# Patient Record
Sex: Female | Born: 1960 | Race: White | Hispanic: No | Marital: Married | Smoking: Never smoker
Health system: Southern US, Community
[De-identification: ages and names within clinical notes are randomized; demographics above are authoritative.]

## PROBLEM LIST (undated history)

## (undated) DIAGNOSIS — M1990 Unspecified osteoarthritis, unspecified site: Secondary | ICD-10-CM

## (undated) DIAGNOSIS — B009 Herpesviral infection, unspecified: Secondary | ICD-10-CM

## (undated) DIAGNOSIS — E785 Hyperlipidemia, unspecified: Secondary | ICD-10-CM

## (undated) DIAGNOSIS — G4730 Sleep apnea, unspecified: Secondary | ICD-10-CM

## (undated) DIAGNOSIS — Z973 Presence of spectacles and contact lenses: Secondary | ICD-10-CM

## (undated) DIAGNOSIS — F419 Anxiety disorder, unspecified: Secondary | ICD-10-CM

## (undated) DIAGNOSIS — S42252A Displaced fracture of greater tuberosity of left humerus, initial encounter for closed fracture: Secondary | ICD-10-CM

## (undated) DIAGNOSIS — I10 Essential (primary) hypertension: Secondary | ICD-10-CM

## (undated) DIAGNOSIS — F32A Depression, unspecified: Secondary | ICD-10-CM

## (undated) DIAGNOSIS — F329 Major depressive disorder, single episode, unspecified: Secondary | ICD-10-CM

## (undated) HISTORY — PX: WISDOM TOOTH EXTRACTION: SHX21

## (undated) HISTORY — DX: Herpesviral infection, unspecified: B00.9

---

## 1998-02-03 ENCOUNTER — Other Ambulatory Visit: Admission: RE | Admit: 1998-02-03 | Discharge: 1998-02-03 | Payer: Self-pay | Admitting: Dermatology

## 1999-10-28 ENCOUNTER — Other Ambulatory Visit: Admission: RE | Admit: 1999-10-28 | Discharge: 1999-10-28 | Payer: Self-pay | Admitting: *Deleted

## 2000-11-01 ENCOUNTER — Other Ambulatory Visit: Admission: RE | Admit: 2000-11-01 | Discharge: 2000-11-01 | Payer: Self-pay | Admitting: *Deleted

## 2001-11-01 ENCOUNTER — Other Ambulatory Visit: Admission: RE | Admit: 2001-11-01 | Discharge: 2001-11-01 | Payer: Self-pay | Admitting: *Deleted

## 2002-08-05 ENCOUNTER — Ambulatory Visit (HOSPITAL_COMMUNITY): Admission: RE | Admit: 2002-08-05 | Discharge: 2002-08-05 | Payer: Self-pay | Admitting: Family Medicine

## 2002-09-18 HISTORY — PX: TUBAL LIGATION: SHX77

## 2002-11-05 ENCOUNTER — Other Ambulatory Visit: Admission: RE | Admit: 2002-11-05 | Discharge: 2002-11-05 | Payer: Self-pay | Admitting: *Deleted

## 2003-06-09 ENCOUNTER — Ambulatory Visit (HOSPITAL_COMMUNITY): Admission: RE | Admit: 2003-06-09 | Discharge: 2003-06-09 | Payer: Self-pay | Admitting: Obstetrics and Gynecology

## 2003-06-09 ENCOUNTER — Ambulatory Visit (HOSPITAL_BASED_OUTPATIENT_CLINIC_OR_DEPARTMENT_OTHER): Admission: RE | Admit: 2003-06-09 | Discharge: 2003-06-09 | Payer: Self-pay | Admitting: Obstetrics and Gynecology

## 2003-11-11 ENCOUNTER — Other Ambulatory Visit: Admission: RE | Admit: 2003-11-11 | Discharge: 2003-11-11 | Payer: Self-pay | Admitting: *Deleted

## 2004-06-23 ENCOUNTER — Encounter: Admission: RE | Admit: 2004-06-23 | Discharge: 2004-06-23 | Payer: Self-pay | Admitting: *Deleted

## 2004-09-18 HISTORY — PX: SHOULDER ARTHROSCOPY: SHX128

## 2004-11-11 ENCOUNTER — Other Ambulatory Visit: Admission: RE | Admit: 2004-11-11 | Discharge: 2004-11-11 | Payer: Self-pay | Admitting: *Deleted

## 2005-05-15 ENCOUNTER — Ambulatory Visit (HOSPITAL_COMMUNITY): Admission: RE | Admit: 2005-05-15 | Discharge: 2005-05-15 | Payer: Self-pay | Admitting: Orthopedic Surgery

## 2005-05-15 ENCOUNTER — Ambulatory Visit (HOSPITAL_BASED_OUTPATIENT_CLINIC_OR_DEPARTMENT_OTHER): Admission: RE | Admit: 2005-05-15 | Discharge: 2005-05-15 | Payer: Self-pay | Admitting: Orthopedic Surgery

## 2005-06-26 ENCOUNTER — Encounter: Admission: RE | Admit: 2005-06-26 | Discharge: 2005-06-26 | Payer: Self-pay | Admitting: Obstetrics and Gynecology

## 2005-07-06 ENCOUNTER — Encounter: Admission: RE | Admit: 2005-07-06 | Discharge: 2005-07-06 | Payer: Self-pay | Admitting: Obstetrics and Gynecology

## 2005-11-15 ENCOUNTER — Other Ambulatory Visit: Admission: RE | Admit: 2005-11-15 | Discharge: 2005-11-15 | Payer: Self-pay | Admitting: Obstetrics & Gynecology

## 2006-07-09 ENCOUNTER — Encounter: Admission: RE | Admit: 2006-07-09 | Discharge: 2006-07-09 | Payer: Self-pay | Admitting: Obstetrics & Gynecology

## 2006-07-25 ENCOUNTER — Encounter: Admission: RE | Admit: 2006-07-25 | Discharge: 2006-07-25 | Payer: Self-pay | Admitting: Obstetrics & Gynecology

## 2006-11-16 ENCOUNTER — Other Ambulatory Visit: Admission: RE | Admit: 2006-11-16 | Discharge: 2006-11-16 | Payer: Self-pay | Admitting: Obstetrics & Gynecology

## 2007-01-23 ENCOUNTER — Encounter: Admission: RE | Admit: 2007-01-23 | Discharge: 2007-01-23 | Payer: Self-pay | Admitting: Obstetrics & Gynecology

## 2007-07-15 ENCOUNTER — Encounter: Admission: RE | Admit: 2007-07-15 | Discharge: 2007-07-15 | Payer: Self-pay | Admitting: Obstetrics & Gynecology

## 2007-12-26 ENCOUNTER — Other Ambulatory Visit: Admission: RE | Admit: 2007-12-26 | Discharge: 2007-12-26 | Payer: Self-pay | Admitting: Obstetrics & Gynecology

## 2008-07-15 ENCOUNTER — Encounter: Admission: RE | Admit: 2008-07-15 | Discharge: 2008-07-15 | Payer: Self-pay | Admitting: Obstetrics & Gynecology

## 2009-01-06 ENCOUNTER — Other Ambulatory Visit: Admission: RE | Admit: 2009-01-06 | Discharge: 2009-01-06 | Payer: Self-pay | Admitting: Obstetrics & Gynecology

## 2009-07-16 ENCOUNTER — Encounter: Admission: RE | Admit: 2009-07-16 | Discharge: 2009-07-16 | Payer: Self-pay | Admitting: Family Medicine

## 2010-07-18 ENCOUNTER — Encounter: Admission: RE | Admit: 2010-07-18 | Discharge: 2010-07-18 | Payer: Self-pay | Admitting: Family Medicine

## 2010-07-29 ENCOUNTER — Encounter: Admission: RE | Admit: 2010-07-29 | Discharge: 2010-07-29 | Payer: Self-pay | Admitting: Family Medicine

## 2010-09-24 ENCOUNTER — Encounter
Admission: RE | Admit: 2010-09-24 | Discharge: 2010-09-24 | Payer: Self-pay | Source: Home / Self Care | Attending: Family Medicine | Admitting: Family Medicine

## 2011-02-03 NOTE — Op Note (Signed)
NAMEELVIE, PALOMO               ACCOUNT NO.:  [id number]   MEDICAL RECORD NO.:  [id number]          PATIENT TYPE:  AMB   LOCATION:  DSC                          FACILITY:  [facility]   PHYSICIAN:  Robert A. Thurston Hole, M.D. DATE OF BIRTH:  03/19/1961   DATE OF PROCEDURE:  05/15/2005  DATE OF DISCHARGE:                                 OPERATIVE REPORT   PREOPERATIVE DIAGNOSIS:  1.  Left shoulder partial labrum tear with impingement.  2.  Left shoulder acromioclavicular joint arthropathy.   POSTOPERATIVE DIAGNOSIS:  1.  Left shoulder partial labrum tear with impingement.  2.  Left shoulder acromioclavicular joint arthropathy.   PROCEDURE:  1.  Left shoulder examination under anesthesia followed by arthroscopic      debridement partial labral tear.  2.  Left shoulder subacromial decompression.  3.  Left shoulder distal clavicle excision.   SURGEON:  Elana Alm. Thurston Hole, M.D.   ASSISTANT:  Julien Girt, P.A.   ANESTHESIA:  General.   OPERATIVE TIME:  40 minutes.   COMPLICATIONS:  None.   INDICATIONS FOR PROCEDURE:  Ms. Oo is a 50 year old woman who has had  significant left shoulder pain for the past 5-6 months, increasing in  nature, with exam and MRI documenting impingement with rotator cuff  tenonitis AC joint arthropathy.  She has failed conservative care and is now  to undergo arthroscopy.   DESCRIPTION OF PROCEDURE:  Ms. Buschman was brought to the operating room on  May 15, 2005, after an interscalene block had been placed in the holding  room by anesthesia.  She was placed on the operating table in supine  position.  Her left shoulder was examined.  She had full range of motion and  her shoulder was stable ligamentous exam.  She was then placed in a beach  chair position and her shoulder and arm were prepped using sterile DuraPrep  and draped using sterile technique.  Originally, through a posterior  arthroscopic portal, the arthroscope with a pump attached was  placed and  through an anterior portal, an arthroscopic probe was placed.  On initial  inspection, the articular cartilage in the glenohumeral joint was found to  be intact.  The anterior labrum had partial tearing 25% which was debrided.  The inferior labrum and anterior inferior glenohumeral ligament complex was  intact.  The superior labrum and biceps tendon anchor was intact, the biceps  tendon was intact, the rotator cuff was thoroughly inspected and it was  found to be intact.  The inferior capsular recess was free of pathology.  The subacromial space was entered and a lateral arthroscopic portal was  made.  A large amount of bursitis was resected.  The rotator cuff was  inflamed and thickened on the bursal surface but no evidence of a tear.  Impingement was noted and a subacromial decompression was carried out  removing 6 mm of the under surface of the anterior, anterolateral and  anteromedial acromion, and CA ligament release carried out, as well.  The [facility]  joint showed significant spurring and degenerative changes and the distal 5-  6 mm of  clavicle was resected with a 6 mm bur.  After this was done, the  shoulder could be brought through a full range of motion with no impingement  on the rotator cuff.  At this point, it was felt that all the pathology had  been satisfactorily addressed.  The instruments were removed.  The portals  were closed with 3-0 nylon sutures.  Sterile dressings and a sling were  applied.  The patient was awakened and taken to the recovery room in stable  condition.   FOLLOW UP CARE:  Ms. Huneke will be followed as an outpatient on Vicodin and  Celebrex.  See her back in the office in a week for sutures out and follow  up.      Robert A. Thurston Hole, M.D.  Electronically Signed     RAW/MEDQ  D:  05/15/2005  T:  05/15/2005  Job:  [id number]

## 2011-02-03 NOTE — Op Note (Signed)
[name], [name]                         ACCOUNT NO.:  [id number]   MEDICAL RECORD NO.:  [id number]                   PATIENT TYPE:  AMB   LOCATION:  [facility]                                 [facility]ACILITY:  [facility]   PHYSICIAN:  Laqueta Linden, M.D.                 DATE OF BIRTH:  Jul 29, 1961   DATE OF PROCEDURE:  06/09/2003  DATE OF DISCHARGE:                                 OPERATIVE REPORT   PREOPERATIVE DIAGNOSIS:  Desires permanent sterilization.   POSTOPERATIVE DIAGNOSIS:  Desires permanent sterilization.   PROCEDURE:  Bilateral laparoscopic tubal cauterization.   SURGEON:  Laqueta Linden, M.D.   ANESTHESIA:  General endotracheal.   ESTIMATED BLOOD LOSS:  Less than 5 mL.   COMPLICATIONS:  None.   INDICATIONS FOR PROCEDURE:  Alexa [name] is a 51 year old, gravida 1, para  1, Caucasian female who desires permanent sterilization. She had previously  been on combination birth control pills but did not feel well on these. She  stopped her pills in February and has had normal regular menses since. She  has been using barrier contraception. She now desires permanent  sterilization. She has seen the informed consent film, voiced her  understanding and acceptance of all risks including but not limited to  anesthesia risks, infection, bleeding, injury to surrounding structures, the  1/200 failure rate with an increased risk of a tubal pregnancy as well as  the permanency and the irreversibility of the procedure. She has also been  extensively counselled as to the alternatives as well as age related  menstrual changes that may ensue that may require resumption of oral  contraceptives at some point. She has seen the informed consent film, voiced  her understanding and acceptance of all risks and agrees to proceed.   DESCRIPTION OF PROCEDURE:  The patient was taken to the operating room and  after proper identification and consents were ascertained, she was placed on  the operating  table in the supine position. After the induction of general  endotracheal anesthesia, she was placed in the [place] stirrups and the  abdomen, perineum and vagina were prepped and draped in a routine sterile  fashion. The bladder was emptied with a red rubber catheter. Bimanual  examination confirmed an anterior normal size mobile uterus. A Hulka  tenaculum was placed on the anterior lip of the cervix. A 2 cm  infraumbilical incision was made and the Veress needle inserted near the  peritoneal cavity. Intraperitoneal placement was confirmed by hanging drop  and saline installation test. Three liters of CO2 were then infused to  create a pneumoperitoneum. The Veress needle was then removed and the #10/11  disposable trocar inserted without difficulty. Insertion of the laparoscope  was attached camera revealed no obvious injury or bleeding at the insertion  site. A cursory examination of the abdomen and pelvis was within normal  limits. There was a slight amount of blood  in the cul-de-sac consistent with  the patient's current menses. Her uterus was normal size and fairly mobile,  both tubes and ovaries appeared normal. The Kleppinger bipolar forceps were  then inserted, the uterus was elevated with the Hulka and the right  fallopian tube was grasped 2 cm from the cornua and cauterized until the amp  meter read zero. Successive distal cauterizations were then performed for a  total cauterized length of 3 cm. Cautery was noted to extend into the  mesosalpinx on both sides. An identical procedure was carried out on the  left fallopian tube with a total cauterized length of 3 cm. Hemostasis was  excellent. The pneumoperitoneum was allowed to escape. The scope was then  withdrawn under direct vision. The incision was closed with interrupted  subcuticular sutures of 3-0  Dexon. The incision was injected with 10 mL of  0.25% plain Marcaine. Steri-Strips and pressure dressing were applied. The   patient received Toradol 30 mg IV, 30 mg IM prior to conclusion of the  procedure. She was stable and extubated on transfer to the PACU. She will be  observed and discharged per anesthesia protocol. She will followup in the  office in 1-2 weeks time. She was given routine verbal and written discharge  instructions and understands that she needs to continue barrier  contraception until her next menstrual period. She was told to take Advil or  Aleve as needed for cramping and given a prescription for Percocet 5/325,  dispensed 15, 1-2 every 4-6h p.r.n. pain. She is to followup in the office  in 1-2 weeks or sooner for any problems.                                               Laqueta Linden, M.D.    LKS/MEDQ  D:  06/09/2003  T:  06/09/2003  Job:  [id number]

## 2011-06-28 ENCOUNTER — Other Ambulatory Visit: Payer: Self-pay | Admitting: Family Medicine

## 2011-06-28 DIAGNOSIS — Z1231 Encounter for screening mammogram for malignant neoplasm of breast: Secondary | ICD-10-CM

## 2011-08-02 ENCOUNTER — Ambulatory Visit
Admission: RE | Admit: 2011-08-02 | Discharge: 2011-08-02 | Disposition: A | Discharge status: Home / Self Care | Payer: Commercial Managed Care - PPO | Source: Ambulatory Visit | Attending: Family Medicine | Admitting: Family Medicine

## 2011-08-02 DIAGNOSIS — Z1231 Encounter for screening mammogram for malignant neoplasm of breast: Secondary | ICD-10-CM

## 2012-07-15 ENCOUNTER — Other Ambulatory Visit: Payer: Self-pay | Admitting: Family Medicine

## 2012-07-15 DIAGNOSIS — Z1231 Encounter for screening mammogram for malignant neoplasm of breast: Secondary | ICD-10-CM

## 2012-08-21 ENCOUNTER — Ambulatory Visit
Admission: RE | Admit: 2012-08-21 | Discharge: 2012-08-21 | Disposition: A | Discharge status: Home / Self Care | Payer: Commercial Managed Care - PPO | Source: Ambulatory Visit | Attending: Family Medicine | Admitting: Family Medicine

## 2012-08-21 DIAGNOSIS — Z1231 Encounter for screening mammogram for malignant neoplasm of breast: Secondary | ICD-10-CM

## 2013-01-29 ENCOUNTER — Encounter (HOSPITAL_COMMUNITY): Payer: Self-pay | Admitting: Emergency Medicine

## 2013-01-29 ENCOUNTER — Emergency Department (HOSPITAL_COMMUNITY)
Admission: EM | Admit: 2013-01-29 | Discharge: 2013-01-29 | Disposition: A | Discharge status: Home / Self Care | Payer: 59 | Attending: Emergency Medicine | Admitting: Emergency Medicine

## 2013-01-29 ENCOUNTER — Emergency Department (HOSPITAL_COMMUNITY): Payer: 59

## 2013-01-29 DIAGNOSIS — F411 Generalized anxiety disorder: Secondary | ICD-10-CM | POA: Insufficient documentation

## 2013-01-29 DIAGNOSIS — Z23 Encounter for immunization: Secondary | ICD-10-CM | POA: Insufficient documentation

## 2013-01-29 DIAGNOSIS — Z79899 Other long term (current) drug therapy: Secondary | ICD-10-CM | POA: Insufficient documentation

## 2013-01-29 DIAGNOSIS — F3289 Other specified depressive episodes: Secondary | ICD-10-CM | POA: Insufficient documentation

## 2013-01-29 DIAGNOSIS — E785 Hyperlipidemia, unspecified: Secondary | ICD-10-CM | POA: Insufficient documentation

## 2013-01-29 DIAGNOSIS — I10 Essential (primary) hypertension: Secondary | ICD-10-CM | POA: Insufficient documentation

## 2013-01-29 DIAGNOSIS — W010XXA Fall on same level from slipping, tripping and stumbling without subsequent striking against object, initial encounter: Secondary | ICD-10-CM | POA: Insufficient documentation

## 2013-01-29 DIAGNOSIS — Z7982 Long term (current) use of aspirin: Secondary | ICD-10-CM | POA: Insufficient documentation

## 2013-01-29 DIAGNOSIS — F329 Major depressive disorder, single episode, unspecified: Secondary | ICD-10-CM | POA: Insufficient documentation

## 2013-01-29 DIAGNOSIS — Y92009 Unspecified place in unspecified non-institutional (private) residence as the place of occurrence of the external cause: Secondary | ICD-10-CM | POA: Insufficient documentation

## 2013-01-29 DIAGNOSIS — Y9389 Activity, other specified: Secondary | ICD-10-CM | POA: Insufficient documentation

## 2013-01-29 DIAGNOSIS — S42253A Displaced fracture of greater tuberosity of unspecified humerus, initial encounter for closed fracture: Secondary | ICD-10-CM | POA: Insufficient documentation

## 2013-01-29 DIAGNOSIS — IMO0001 Reserved for inherently not codable concepts without codable children: Secondary | ICD-10-CM

## 2013-01-29 HISTORY — DX: Anxiety disorder, unspecified: F41.9

## 2013-01-29 HISTORY — DX: Depression, unspecified: F32.A

## 2013-01-29 HISTORY — DX: Hyperlipidemia, unspecified: E78.5

## 2013-01-29 HISTORY — DX: Major depressive disorder, single episode, unspecified: F32.9

## 2013-01-29 HISTORY — DX: Essential (primary) hypertension: I10

## 2013-01-29 LAB — COMPREHENSIVE METABOLIC PANEL
ALT: 22 U/L (ref 0–35)
Albumin: 4 g/dL (ref 3.5–5.2)
Alkaline Phosphatase: 64 U/L (ref 39–117)
Calcium: 9.4 mg/dL (ref 8.4–10.5)
GFR calc Af Amer: 90 mL/min — ABNORMAL LOW (ref >90)
Glucose, Bld: 102 mg/dL — ABNORMAL HIGH (ref 70–99)
Potassium: 3.7 mEq/L (ref 3.5–5.1)
Sodium: 140 mEq/L (ref 135–145)
Total Protein: 7.2 g/dL (ref 6.0–8.3)

## 2013-01-29 LAB — CBC WITH DIFFERENTIAL/PLATELET
Basophils Absolute: 0 10*3/uL (ref 0.0–0.1)
Basophils Relative: 0 % (ref 0–1)
Eosinophils Absolute: 0.1 10*3/uL (ref 0.0–0.7)
Eosinophils Relative: 1 % (ref 0–5)
Lymphs Abs: 1 10*3/uL (ref 0.7–4.0)
MCH: 29.1 pg (ref 26.0–34.0)
MCHC: 32.6 g/dL (ref 30.0–36.0)
MCV: 89.2 fL (ref 78.0–100.0)
Neutrophils Relative %: 84 % — ABNORMAL HIGH (ref 43–77)
Platelets: 210 10*3/uL (ref 150–400)
RBC: 4.64 MIL/uL (ref 3.87–5.11)
RDW: 13.3 % (ref 11.5–15.5)

## 2013-01-29 MED ORDER — HYDROMORPHONE HCL PF 1 MG/ML IJ SOLN
1.0000 mg | Freq: Once | INTRAMUSCULAR | Status: AC
Start: 2013-01-29 — End: 2013-01-29
  Administered 2013-01-29: 1 mg via INTRAVENOUS
  Filled 2013-01-29: qty 1

## 2013-01-29 MED ORDER — OXYCODONE-ACETAMINOPHEN 5-325 MG PO TABS: 2.0000 | ORAL_TABLET | ORAL | Status: DC | PRN

## 2013-01-29 MED ORDER — PROPOFOL 10 MG/ML IV BOLUS
INTRAVENOUS | Status: DC | PRN
  Administered 2013-01-29: 50 mg via INTRAVENOUS
  Administered 2013-01-29: 96.2 mg via INTRAVENOUS

## 2013-01-29 MED ORDER — HYDROMORPHONE HCL PF 1 MG/ML IJ SOLN
1.0000 mg | Freq: Once | INTRAMUSCULAR | Status: AC
  Administered 2013-01-29: 1 mg via INTRAVENOUS
  Filled 2013-01-29: qty 1

## 2013-01-29 MED ORDER — ONDANSETRON HCL 4 MG PO TABS: 4.0000 mg | ORAL_TABLET | Freq: Four times a day (QID) | ORAL | Status: DC

## 2013-01-29 MED ORDER — TETANUS-DIPHTH-ACELL PERTUSSIS 5-2.5-18.5 LF-MCG/0.5 IM SUSP
0.5000 mL | Freq: Once | INTRAMUSCULAR | Status: AC
  Administered 2013-01-29: 0.5 mL via INTRAMUSCULAR
  Filled 2013-01-29 (×2): qty 0.5

## 2013-01-29 MED ORDER — PROPOFOL 10 MG/ML IV BOLUS
0.5000 mg/kg | Freq: Once | INTRAVENOUS | Status: DC
  Filled 2013-01-29: qty 20

## 2013-01-29 NOTE — ED Provider Notes (Signed)
History     CSN: [id number]  Arrival date & time 01/29/13  0820   First MD Initiated Contact with Patient 01/29/13 [phone]      Chief Complaint  Patient presents with  . Shoulder Pain    (Consider location/radiation/quality/duration/timing/severity/associated sxs/prior treatment) HPI Comments: Complains of left shoulder pain after slipping and falling in her front yard. She states she was chasing after her dog and slipped on the wet grass landing with her arms stretched in front of her. Did not hit her head or lose consciousness. Denies head, neck, back, chest or abdominal pain. Complains of pain in the left lateral and posterior shoulder. No deformity noted. She reports numbness and tingling. Previous shoulder surgery about 7 years ago by Dr. Thurston Hole. She denies anticoagulant use.  The history is provided by the patient and the EMS personnel.    Past Medical History  Diagnosis Date  . Hypertension   . Hyperlipidemia   . Anxiety   . Depression     No past surgical history on file.  No family history on file.  History  Substance Use Topics  . Smoking status: Not on file  . Smokeless tobacco: Not on file  . Alcohol Use: Not on file    OB History   Grav Para Term Preterm Abortions TAB SAB Ect Mult Living                  Review of Systems  Constitutional: Negative for fever, activity change and appetite change.  HENT: Negative for congestion and rhinorrhea.   Respiratory: Negative for cough, chest tightness and shortness of breath.   Cardiovascular: Negative for chest pain.  Gastrointestinal: Negative for nausea, vomiting and abdominal pain.  Genitourinary: Negative for dysuria and hematuria.  Musculoskeletal: Positive for myalgias and arthralgias. Negative for back pain.  Skin: Negative for rash.  Neurological: Negative for dizziness, weakness and headaches.  A complete 10 system review of systems was obtained and all systems are negative except as noted in the HPI  and PMH.    Allergies  Review of patient's allergies indicates no known allergies.  Home Medications   Current Outpatient Rx  Name  Route  Sig  Dispense  Refill  . acyclovir (ZOVIRAX) 400 MG tablet   Oral   Take 400 mg by mouth daily.         Marland Kitchen aspirin EC 81 MG tablet   Oral   Take 81 mg by mouth daily.         Marland Kitchen buPROPion (WELLBUTRIN XL) 300 MG 24 hr tablet   Oral   Take 300 mg by mouth daily.         . Calcium Carb-Cholecalciferol (CALCIUM + D3) 600-200 MG-UNIT TABS   Oral   Take 2 tablets by mouth daily.         . celecoxib (CELEBREX) 200 MG capsule   Oral   Take 200 mg by mouth daily.         . fish oil-omega-3 fatty acids 1000 MG capsule   Oral   Take 1 g by mouth daily.         . hydrochlorothiazide (MICROZIDE) 12.5 MG capsule   Oral   Take 12.5 mg by mouth daily.         Marland Kitchen Lysine 500 MG TABS   Oral   Take 1 tablet by mouth daily.         . metoprolol succinate (TOPROL-XL) 100 MG 24 hr tablet   Oral  Take 50 mg by mouth 2 (two) times daily. Take with or immediately following a meal.         . Multiple Vitamin (MULTIVITAMIN WITH MINERALS) TABS   Oral   Take 1 tablet by mouth daily.         . QUEtiapine (SEROQUEL XR) 50 MG TB24   Oral   Take 50 mg by mouth at bedtime.         . simvastatin (ZOCOR) 5 MG tablet   Oral   Take 5 mg by mouth at bedtime.         Marland Kitchen venlafaxine (EFFEXOR) 75 MG tablet   Oral   Take 150 mg by mouth.         . ondansetron (ZOFRAN) 4 MG tablet   Oral   Take 1 tablet (4 mg total) by mouth every 6 (six) hours.   12 tablet   0   . oxyCODONE-acetaminophen (PERCOCET/ROXICET) 5-325 MG per tablet   Oral   Take 2 tablets by mouth every 4 (four) hours as needed for pain.   15 tablet   0     BP 123/89  Pulse 73  Temp(Src) 98 F (36.7 C) (Oral)  Resp 18  Wt 212 lb (96.163 kg)  SpO2 100%  Physical Exam  Constitutional: She is oriented to person, place, and time. She appears well-developed and  well-nourished. She appears distressed.  HENT:  Head: Normocephalic and atraumatic.  Mouth/Throat: Oropharynx is clear and moist. No oropharyngeal exudate.  Eyes: Conjunctivae and EOM are normal. Pupils are equal, round, and reactive to light.  Neck: Normal range of motion. Neck supple.  Cardiovascular: Normal rate, regular rhythm and normal heart sounds.   No murmur heard. Pulmonary/Chest: Effort normal and breath sounds normal.  Abdominal: Soft. There is no tenderness. There is no rebound and no guarding.  Musculoskeletal: Normal range of motion. She exhibits tenderness.  TTP L lateral and posterior shoulder.+2 radial pulse, cardinal hand movements intact. Axillary nerve sensation intact. FROM fingers, wrist, elbow. No clavicle tenderness  Neurological: She is alert and oriented to person, place, and time. No cranial nerve deficit. She exhibits normal muscle tone. Coordination normal.  Skin: Skin is warm.    ED Course  Procedural sedation Date/Time: 01/29/2013 4:09 PM Performed by: Glynn Octave Authorized by: Glynn Octave Consent: Verbal consent obtained. written consent obtained. Risks and benefits: risks, benefits and alternatives were discussed Consent given by: patient Patient understanding: patient states understanding of the procedure being performed Patient consent: the patient's understanding of the procedure matches consent given Patient identity confirmed: verbally with patient Time out: Immediately prior to procedure a "time out" was called to verify the correct patient, procedure, equipment, support staff and site/side marked as required. Local anesthesia used: no Patient sedated: yes Sedation type: moderate (conscious) sedation Sedatives: propofol Analgesia: hydromorphone Sedation start date/time: 01/29/2013 12:09 PM Sedation end date/time: 01/29/2013 12:23 PM Vitals: Vital signs were monitored during sedation. Patient tolerance: Patient tolerated the procedure  well with no immediate complications.   (including critical care time)  Labs Reviewed  CBC WITH DIFFERENTIAL - Abnormal; Notable for the following:    Neutrophils Relative % 84 (*)    Lymphocytes Relative 11 (*)    All other components within normal limits  COMPREHENSIVE METABOLIC PANEL - Abnormal; Notable for the following:    Glucose, Bld 102 (*)    GFR calc non Af Amer 77 (*)    GFR calc Af Amer 90 (*)    All  other components within normal limits  PROTIME-INR   Dg Chest 1 View  01/29/2013   *RADIOLOGY REPORT*  Clinical Data: Fall, shoulder pain  CHEST - 1 VIEW  Comparison: None.  Findings: Lungs are clear. No pleural effusion or pneumothorax.  The heart is normal in size.  Anterior left shoulder dislocation, better visualized on shoulder radiographs.  IMPRESSION: No evidence of acute cardiopulmonary disease.  Anterior left shoulder dislocation.   Original Report Authenticated By: Charline Bills, M.D.   Dg Elbow Complete Left  01/29/2013   *RADIOLOGY REPORT*  Clinical Data: Status post fall.  Elbow pain.  LEFT ELBOW - COMPLETE 3+ VIEW  Comparison: None.  Findings: There is no fracture, dislocation or joint effusion. Enthesopathic change at the common extensor tendon origin is incidentally noted.  IMPRESSION: No acute abnormality.   Original Report Authenticated By: Holley Dexter, M.D.   Ct Head Wo Contrast  01/29/2013   *RADIOLOGY REPORT*  Clinical Data:  Fall forward.  Trauma to front of head.  CT HEAD WITHOUT CONTRAST CT CERVICAL SPINE WITHOUT CONTRAST  Technique:  Multidetector CT imaging of the head and cervical spine was performed following the standard protocol without intravenous contrast.  Multiplanar CT image reconstructions of the cervical spine were also generated.  Comparison:  MRI of the cervical spine 09/24/2010.  CT HEAD  Findings: No acute intracranial abnormality is present. Specifically, there is no evidence for acute infarct, hemorrhage, mass, hydrocephalus, or  extra-axial fluid collection.  The paranasal sinuses and mastoid air cells are clear.  The globes and orbits are intact.  The osseous skull is intact.  IMPRESSION: Negative CT of the head.  CT CERVICAL SPINE  Findings: The cervical spine is imaged from the skull base through T2-3.  Degenerative anterolisthesis is present at C3-4 with right greater than left facet and uncovertebral disease leading to moderate right foraminal stenosis.  Mild degenerative anterolisthesis is present at C4-5.  There is chronic loss of disc height and endplate osteophyte formation at C5-6 and C6-7 with bilateral osseous foraminal narrowing, left greater than right.  The lung apices are clear.  The soft tissues are unremarkable.  IMPRESSION:  1.  Slight progression of degenerative changes in the cervical spine, most pronounced at C3-4, C5-6, and C6-7. 2.  No evidence for acute fracture or traumatic subluxation peri   Original Report Authenticated By: Marin Roberts, M.D.   Ct Cervical Spine Wo Contrast  01/29/2013   *RADIOLOGY REPORT*  Clinical Data:  Fall forward.  Trauma to front of head.  CT HEAD WITHOUT CONTRAST CT CERVICAL SPINE WITHOUT CONTRAST  Technique:  Multidetector CT imaging of the head and cervical spine was performed following the standard protocol without intravenous contrast.  Multiplanar CT image reconstructions of the cervical spine were also generated.  Comparison:  MRI of the cervical spine 09/24/2010.  CT HEAD  Findings: No acute intracranial abnormality is present. Specifically, there is no evidence for acute infarct, hemorrhage, mass, hydrocephalus, or extra-axial fluid collection.  The paranasal sinuses and mastoid air cells are clear.  The globes and orbits are intact.  The osseous skull is intact.  IMPRESSION: Negative CT of the head.  CT CERVICAL SPINE  Findings: The cervical spine is imaged from the skull base through T2-3.  Degenerative anterolisthesis is present at C3-4 with right greater than left  facet and uncovertebral disease leading to moderate right foraminal stenosis.  Mild degenerative anterolisthesis is present at C4-5.  There is chronic loss of disc height and endplate osteophyte formation at  C5-6 and C6-7 with bilateral osseous foraminal narrowing, left greater than right.  The lung apices are clear.  The soft tissues are unremarkable.  IMPRESSION:  1.  Slight progression of degenerative changes in the cervical spine, most pronounced at C3-4, C5-6, and C6-7. 2.  No evidence for acute fracture or traumatic subluxation peri   Original Report Authenticated By: Marin Roberts, M.D.   Ct Shoulder Left Wo Contrast  01/29/2013   *RADIOLOGY REPORT*  Clinical Data: Left shoulder dislocation after fall.  Pain.  Hill- Sachs lesion.  CT OF THE LEFT SHOULDER WITHOUT CONTRAST  Technique:  Multidetector CT imaging was performed according to the standard protocol. Multiplanar CT image reconstructions were also generated.  Comparison: Plain films earlier this same date.  Findings: The humerus is located.  The patient has a very large Hill-Sachs deformity with a bony fragment sheared off of the posterolateral humeral head measuring 3.5 cm AP by 1.2 cm cranial- caudal by 3.5 cm transverse identified.  A few smaller fragments are also identified off the anterior margin of the humeral head. There is no bony Bankart lesion.  The acromioclavicular joint is intact with some degenerative change noted.  No rib fracture is identified.  Imaged lung parenchyma demonstrates some mild dependent atelectasis is otherwise unremarkable.  IMPRESSION: Anterior shoulder dislocation has been reduced.  Large Hill-Sachs deformity is identified with bony fragments in the joint identified as described above.  No bony Bankart is seen.   Original Report Authenticated By: Holley Dexter, M.D.   Dg Shoulder Left  01/29/2013   *RADIOLOGY REPORT*  Clinical Data: Status post reduction of dislocation.  LEFT SHOULDER - 2+ VIEW   Comparison: Plain films left shoulder 01/29/2013 8:48 a.m.  Findings: Anterior shoulder dislocation has been reduced.  Large fracture fragment in the joint consistent with Hill-Sachs deformity again seen.  No new abnormality.  IMPRESSION: Successful reduction of anterior shoulder dislocation with a large bony fragment in the joint identified.   Original Report Authenticated By: Holley Dexter, M.D.   Dg Shoulder Left  01/29/2013   *RADIOLOGY REPORT*  Clinical Data: Fall, shoulder pain  LEFT SHOULDER - 2+ VIEW  Comparison: None.  Findings: Anterior inferior left shoulder dislocation.  Associated comminuted fracture of the humeral head with dominant fragment involving the greater tuberosity.  Mild degenerative changes of the acromioclavicular joint.  Visualized left lung is clear.  IMPRESSION: Fracture dislocation involving the left humeral head, as described above.   Original Report Authenticated By: Charline Bills, M.D.     1. Traumatic closed displaced fracture of shoulder w/anterior dislocation, left, closed, initial encounter       MDM  Mechanical fall with left shoulder pain. No loss of consciousness. L shoulder pain and deformity.  Denies head and neck pain.  Fracture dislocation of left shoulder as above. D/w Dr. Thurston Hole who patient has seen previously. Dr. Thurston Hole states not to attempt reduction and he or one of his partners will evaluate.  Patient seen with Dr. Dion Saucier. Conscious sedation performed for bedside reduction of fracture dislocation by Dr. Dion Saucier. Patient tolerated well.  Reduction of fracture dislocation confirmed on x-ray. CT scan obtained at Dr. Shelba Flake request.  Sling placed. She stable for followup with Dr. Dion Saucier one week.   Date: 01/29/2013  Rate: 69  Rhythm: normal sinus rhythm  QRS Axis: normal  Intervals: normal  ST/T Wave abnormalities: normal  Conduction Disutrbances:none  Narrative Interpretation:   Old EKG Reviewed: none available    Glynn Octave, MD 01/29/13 1611

## 2013-01-29 NOTE — Progress Notes (Signed)
Orthopedic Tech Progress Note Patient Details:  Alexa Li [birth year] [id number]  Ortho Devices Type of Ortho Device: Sling immobilizer Ortho Device/Splint Location: Shoulder abduction with Dr. Dion Saucier . Ortho Device/Splint Interventions: Application   Alexa Li, Alexa Li 01/29/2013, 12:34 PM

## 2013-01-29 NOTE — Consult Note (Signed)
ORTHOPAEDIC CONSULTATION  REQUESTING PHYSICIAN: Glynn Octave, MD  Chief Complaint: Left shoulder pain, fall  HPI: Alexa Li is a 52 y.o. female who complains of  acute severe left shoulder pain after a fall today. She was trying to chase her dog who was running out into the street. She had acute onset deformity, unable left arm, came in the emergency room. She has a past history of left shoulder arthroscopy with acromioplasty, and had full recovery after that. This is 7 years ago done by Dr. Thurston Hole. She reports some numbness and tingling around the shoulder, as well as going down towards her hand. Pain is rated as severe, denies pain in her head or other locations.  Past Medical History  Diagnosis Date  . Hypertension   . Hyperlipidemia   . Anxiety   . Depression    No past surgical history on file. History   Social History  . Marital Status: Married    Spouse Name: N/A    Number of Children: N/A  . Years of Education: N/A   Social History Main Topics  . Smoking status: Not on file  . Smokeless tobacco: Not on file  . Alcohol Use: Not on file  . Drug Use: Not on file  . Sexually Active: Not on file   Other Topics Concern  . Not on file   Social History Narrative  . No narrative on file   No family history on file. she is a nonsmoker, and her father is living, her mother had died, and has had a past history of cardiac disease. No Known Allergies Prior to Admission medications   Medication Sig Start Date End Date Taking? Authorizing Provider  acyclovir (ZOVIRAX) 400 MG tablet Take 400 mg by mouth daily.   Yes Historical Provider, MD  aspirin EC 81 MG tablet Take 81 mg by mouth daily.   Yes Historical Provider, MD  buPROPion (WELLBUTRIN XL) 300 MG 24 hr tablet Take 300 mg by mouth daily.   Yes Historical Provider, MD  Calcium Carb-Cholecalciferol (CALCIUM + D3) 600-200 MG-UNIT TABS Take 2 tablets by mouth daily.   Yes Historical Provider, MD  celecoxib (CELEBREX)  200 MG capsule Take 200 mg by mouth daily.   Yes Historical Provider, MD  fish oil-omega-3 fatty acids 1000 MG capsule Take 1 g by mouth daily.   Yes Historical Provider, MD  hydrochlorothiazide (MICROZIDE) 12.5 MG capsule Take 12.5 mg by mouth daily.   Yes Historical Provider, MD  Lysine 500 MG TABS Take 1 tablet by mouth daily.   Yes Historical Provider, MD  metoprolol succinate (TOPROL-XL) 100 MG 24 hr tablet Take 50 mg by mouth 2 (two) times daily. Take with or immediately following a meal.   Yes Historical Provider, MD  Multiple Vitamin (MULTIVITAMIN WITH MINERALS) TABS Take 1 tablet by mouth daily.   Yes Historical Provider, MD  QUEtiapine (SEROQUEL XR) 50 MG TB24 Take 50 mg by mouth at bedtime.   Yes Historical Provider, MD  simvastatin (ZOCOR) 5 MG tablet Take 5 mg by mouth at bedtime.   Yes Historical Provider, MD  venlafaxine (EFFEXOR) 75 MG tablet Take 150 mg by mouth.   Yes Historical Provider, MD   Dg Chest 1 View  01/29/2013   *RADIOLOGY REPORT*  Clinical Data: Fall, shoulder pain  CHEST - 1 VIEW  Comparison: None.  Findings: Lungs are clear. No pleural effusion or pneumothorax.  The heart is normal in size.  Anterior left shoulder dislocation, better visualized on shoulder  radiographs.  IMPRESSION: No evidence of acute cardiopulmonary disease.  Anterior left shoulder dislocation.   Original Report Authenticated By: Charline Bills, M.D.   Dg Shoulder Left  01/29/2013   *RADIOLOGY REPORT*  Clinical Data: Fall, shoulder pain  LEFT SHOULDER - 2+ VIEW  Comparison: None.  Findings: Anterior inferior left shoulder dislocation.  Associated comminuted fracture of the humeral head with dominant fragment involving the greater tuberosity.  Mild degenerative changes of the acromioclavicular joint.  Visualized left lung is clear.  IMPRESSION: Fracture dislocation involving the left humeral head, as described above.   Original Report Authenticated By: Charline Bills, M.D.    Positive ROS: All  other systems have been reviewed and were otherwise negative with the exception of those mentioned in the HPI and as above.  Physical Exam: General: Alert, in mild distress Cardiovascular: No pedal edema, radial pulses intact on the left upper extremity. Respiratory: No cyanosis, no use of accessory musculature GI: No organomegaly, abdomen is soft and non-tender Skin: No lesions in the area of chief complaint, although she does have a abrasion over the left elbow Neurologic: Decreased sensation over her deltoid, all of her fingers do flex extend and abduct and she seems to have sensation intact throughout her hand. Psychiatric: Patient is competent for consent with normal mood and affect Lymphatic: No axillary or cervical lymphadenopathy  MUSCULOSKELETAL: Left shoulder has gross deformity, unable to lift, with indentation and loss of contour of the lateral shoulder.  Assessment: Left proximal humerus fracture dislocation  Plan: This is an acute severe injury, and may have an associated axillary nerve palsy with that. I discussed the options with her, and recommended urgent closed reduction, with postreduction CAT scan to determine the position of the greater tuberosity fragment. She may ultimately need surgical intervention to fix her tuberosity.  The risks benefits and alternatives were discussed to the urgent closed reduction, and she is willing to proceed. We'll do this today under conscious sedation in the emergency room.  She will then be placed in a sling, given pain medications, get postreduction CAT scan, and followup and with me in the office in one week. I may contact the family sooner if, at multiple number [phone] if a determination for surgical intervention is made sooner than her followup.  Preprocedure diagnosis: Left shoulder proximal humerus fracture dislocation  Postprocedure diagnosis: Same  Procedure: Left shoulder closed reduction under conscious  sedation  Procedure details: After informed verbal consent and written consent was obtained obtained from the patient and the family and a timeout was performed propofol was utilized monitored by the anesthesia provider, Dr. Viviann Spare rang core, and closed reduction of the left shoulder was performed. This is fairly challenging to get back into place, ultimately forward flexion to 180 with manipulation of the humeral head in the axilla manually was able to achieve satisfactory reduction. A satisfactory clunk was achieved. A sling immobilizer was applied, and the patient awakened, and post reduction CT scan is pending.      Eulas Post, MD Cell [phone]   01/29/2013 12:20 PM

## 2013-01-29 NOTE — ED Notes (Signed)
Family at bedside. 

## 2013-01-29 NOTE — ED Notes (Signed)
Patient transported to CT 

## 2013-01-29 NOTE — ED Notes (Signed)
According to EMS, the patient slipped and fell in the front yard on her stomach, face first with arms over head.  The patient denies any other pain or LOC.  She called EMS and she was transported to [facility].  The patient received of Fentanyl and transported to he ED.  According to EMS, she has PMS, no deformities.  They did start a # 20gauge in Right hand.

## 2013-01-29 NOTE — ED Notes (Signed)
Patient starting to wake up, with facial grimacing and pulling arm away from MD. Dr. Manus Gunning verbally ordered more sedation.

## 2013-01-30 ENCOUNTER — Encounter (HOSPITAL_BASED_OUTPATIENT_CLINIC_OR_DEPARTMENT_OTHER): Payer: Self-pay | Admitting: Anesthesiology

## 2013-01-30 ENCOUNTER — Ambulatory Visit (HOSPITAL_BASED_OUTPATIENT_CLINIC_OR_DEPARTMENT_OTHER)
Admission: RE | Admit: 2013-01-30 | Discharge: 2013-01-30 | Disposition: A | Discharge status: Home / Self Care | Payer: 59 | Source: Ambulatory Visit | Attending: Orthopedic Surgery | Admitting: Orthopedic Surgery

## 2013-01-30 ENCOUNTER — Encounter (HOSPITAL_BASED_OUTPATIENT_CLINIC_OR_DEPARTMENT_OTHER)
Admission: RE | Disposition: A | Discharge status: Home / Self Care | Payer: Self-pay | Source: Ambulatory Visit | Attending: Orthopedic Surgery

## 2013-01-30 ENCOUNTER — Ambulatory Visit (HOSPITAL_BASED_OUTPATIENT_CLINIC_OR_DEPARTMENT_OTHER): Payer: 59 | Admitting: Anesthesiology

## 2013-01-30 ENCOUNTER — Ambulatory Visit (HOSPITAL_BASED_OUTPATIENT_CLINIC_OR_DEPARTMENT_OTHER)
Admission: RE | Admit: 2013-01-30 | Payer: Commercial Managed Care - PPO | Source: Ambulatory Visit | Admitting: Orthopedic Surgery

## 2013-01-30 ENCOUNTER — Ambulatory Visit (HOSPITAL_COMMUNITY): Payer: 59

## 2013-01-30 ENCOUNTER — Encounter (HOSPITAL_BASED_OUTPATIENT_CLINIC_OR_DEPARTMENT_OTHER): Payer: Self-pay | Admitting: *Deleted

## 2013-01-30 DIAGNOSIS — F329 Major depressive disorder, single episode, unspecified: Secondary | ICD-10-CM | POA: Insufficient documentation

## 2013-01-30 DIAGNOSIS — W19XXXA Unspecified fall, initial encounter: Secondary | ICD-10-CM | POA: Insufficient documentation

## 2013-01-30 DIAGNOSIS — F3289 Other specified depressive episodes: Secondary | ICD-10-CM | POA: Insufficient documentation

## 2013-01-30 DIAGNOSIS — F411 Generalized anxiety disorder: Secondary | ICD-10-CM | POA: Insufficient documentation

## 2013-01-30 DIAGNOSIS — S42252A Displaced fracture of greater tuberosity of left humerus, initial encounter for closed fracture: Secondary | ICD-10-CM

## 2013-01-30 DIAGNOSIS — S42253A Displaced fracture of greater tuberosity of unspecified humerus, initial encounter for closed fracture: Secondary | ICD-10-CM | POA: Insufficient documentation

## 2013-01-30 DIAGNOSIS — E785 Hyperlipidemia, unspecified: Secondary | ICD-10-CM | POA: Insufficient documentation

## 2013-01-30 DIAGNOSIS — I10 Essential (primary) hypertension: Secondary | ICD-10-CM | POA: Insufficient documentation

## 2013-01-30 HISTORY — DX: Displaced fracture of greater tuberosity of left humerus, initial encounter for closed fracture: S42.252A

## 2013-01-30 HISTORY — PX: ORIF HUMERUS FRACTURE: SHX2126

## 2013-01-30 HISTORY — DX: Presence of spectacles and contact lenses: Z97.3

## 2013-01-30 SURGERY — OPEN REDUCTION INTERNAL FIXATION (ORIF) PROXIMAL HUMERUS FRACTURE
Anesthesia: Regional | Site: Arm Upper | Laterality: Left | Wound class: Clean

## 2013-01-30 MED ORDER — BUPIVACAINE-EPINEPHRINE PF 0.5-1:200000 % IJ SOLN
INTRAMUSCULAR | Status: DC | PRN
  Administered 2013-01-30: 30 mL

## 2013-01-30 MED ORDER — LACTATED RINGERS IV SOLN
INTRAVENOUS | Status: DC
  Administered 2013-01-30 (×2): via INTRAVENOUS

## 2013-01-30 MED ORDER — OXYCODONE-ACETAMINOPHEN 10-325 MG PO TABS: 1.0000 | ORAL_TABLET | Freq: Four times a day (QID) | ORAL | Status: DC | PRN

## 2013-01-30 MED ORDER — PROPOFOL 10 MG/ML IV BOLUS
INTRAVENOUS | Status: DC | PRN
  Administered 2013-01-30: 170 mg via INTRAVENOUS

## 2013-01-30 MED ORDER — ONDANSETRON HCL 4 MG/2ML IJ SOLN
INTRAMUSCULAR | Status: DC | PRN
  Administered 2013-01-30: 4 mg via INTRAVENOUS

## 2013-01-30 MED ORDER — SENNA-DOCUSATE SODIUM 8.6-50 MG PO TABS: 1.0000 | ORAL_TABLET | Freq: Every day | ORAL | Status: DC

## 2013-01-30 MED ORDER — DEXAMETHASONE SODIUM PHOSPHATE 4 MG/ML IJ SOLN
INTRAMUSCULAR | Status: DC | PRN
  Administered 2013-01-30: 10 mg via INTRAVENOUS

## 2013-01-30 MED ORDER — FENTANYL CITRATE 0.05 MG/ML IJ SOLN: 50.0000 ug | INTRAMUSCULAR | Status: DC | PRN

## 2013-01-30 MED ORDER — PHENYLEPHRINE HCL 10 MG/ML IJ SOLN
10.0000 mg | INTRAVENOUS | Status: DC | PRN
  Administered 2013-01-30: 50 ug/min via INTRAVENOUS

## 2013-01-30 MED ORDER — METHOCARBAMOL 500 MG PO TABS: 500.0000 mg | ORAL_TABLET | Freq: Four times a day (QID) | ORAL | Status: DC

## 2013-01-30 MED ORDER — MIDAZOLAM HCL 2 MG/2ML IJ SOLN: 1.0000 mg | INTRAMUSCULAR | Status: DC | PRN

## 2013-01-30 MED ORDER — MIDAZOLAM HCL 2 MG/2ML IJ SOLN
1.0000 mg | INTRAMUSCULAR | Status: DC | PRN
  Administered 2013-01-30: 2 mg via INTRAVENOUS

## 2013-01-30 MED ORDER — CEFAZOLIN SODIUM-DEXTROSE 2-3 GM-% IV SOLR
2.0000 g | INTRAVENOUS | Status: AC
  Administered 2013-01-30: 2 g via INTRAVENOUS

## 2013-01-30 MED ORDER — LIDOCAINE HCL (CARDIAC) 20 MG/ML IV SOLN
INTRAVENOUS | Status: DC | PRN
  Administered 2013-01-30: 100 mg via INTRAVENOUS

## 2013-01-30 MED ORDER — OXYCODONE HCL 5 MG/5ML PO SOLN: 5.0000 mg | Freq: Once | ORAL | Status: DC | PRN

## 2013-01-30 MED ORDER — HYDROMORPHONE HCL PF 1 MG/ML IJ SOLN: 0.2500 mg | INTRAMUSCULAR | Status: DC | PRN

## 2013-01-30 MED ORDER — SUCCINYLCHOLINE CHLORIDE 20 MG/ML IJ SOLN
INTRAMUSCULAR | Status: DC | PRN
  Administered 2013-01-30: 100 mg via INTRAVENOUS

## 2013-01-30 MED ORDER — ONDANSETRON HCL 4 MG/2ML IJ SOLN: 4.0000 mg | Freq: Four times a day (QID) | INTRAMUSCULAR | Status: DC | PRN

## 2013-01-30 MED ORDER — FENTANYL CITRATE 0.05 MG/ML IJ SOLN
50.0000 ug | INTRAMUSCULAR | Status: DC | PRN
Start: 2013-01-30 — End: 2013-01-30
  Administered 2013-01-30: 100 ug via INTRAVENOUS

## 2013-01-30 MED ORDER — OXYCODONE HCL 5 MG PO TABS: 5.0000 mg | ORAL_TABLET | Freq: Once | ORAL | Status: DC | PRN

## 2013-01-30 MED ORDER — PROMETHAZINE HCL 25 MG PO TABS: 25.0000 mg | ORAL_TABLET | Freq: Four times a day (QID) | ORAL | Status: DC | PRN

## 2013-01-30 SURGICAL SUPPLY — 71 items
BENZOIN TINCTURE PRP APPL 2/3 (GAUZE/BANDAGES/DRESSINGS) IMPLANT
BIT DRILL 5/64X5 DISP (BIT) ×2 IMPLANT
BIT DRILL CANN 2.7X625 NONSTRL (BIT) ×2 IMPLANT
BIT DRILL LNG QR ACUTRAK 2 (BIT) ×2 IMPLANT
BLADE SURG 10 STRL SS (BLADE) ×2 IMPLANT
BLADE SURG 15 STRL LF DISP TIS (BLADE) ×2 IMPLANT
BLADE SURG 15 STRL SS (BLADE) ×2
CANISTER SUCTION 2500CC (MISCELLANEOUS) ×2 IMPLANT
CLEANER CAUTERY TIP 5X5 PAD (MISCELLANEOUS) ×1 IMPLANT
CLOTH BEACON ORANGE TIMEOUT ST (SAFETY) ×2 IMPLANT
DECANTER SPIKE VIAL GLASS SM (MISCELLANEOUS) IMPLANT
DRAPE C-ARM 42X72 X-RAY (DRAPES) ×2 IMPLANT
DRAPE INCISE IOBAN 66X45 STRL (DRAPES) ×2 IMPLANT
DRAPE SURG 17X23 STRL (DRAPES) ×2 IMPLANT
DRAPE U 20/CS (DRAPES) ×2 IMPLANT
DRAPE U-SHAPE 47X51 STRL (DRAPES) ×2 IMPLANT
DRAPE U-SHAPE 76X120 STRL (DRAPES) ×4 IMPLANT
DURAPREP 26ML APPLICATOR (WOUND CARE) ×2 IMPLANT
ELECT REM PT RETURN 9FT ADLT (ELECTROSURGICAL) ×2
ELECTRODE REM PT RTRN 9FT ADLT (ELECTROSURGICAL) ×1 IMPLANT
GAUZE SPONGE 4X4 16PLY XRAY LF (GAUZE/BANDAGES/DRESSINGS) IMPLANT
GAUZE XEROFORM 1X8 LF (GAUZE/BANDAGES/DRESSINGS) IMPLANT
GLOVE BIO SURGEON STRL SZ 6.5 (GLOVE) ×2 IMPLANT
GLOVE BIOGEL PI IND STRL 6.5 (GLOVE) ×1 IMPLANT
GLOVE BIOGEL PI IND STRL 7.0 (GLOVE) ×2 IMPLANT
GLOVE BIOGEL PI IND STRL 8 (GLOVE) ×2 IMPLANT
GLOVE BIOGEL PI INDICATOR 6.5 (GLOVE) ×1
GLOVE BIOGEL PI INDICATOR 7.0 (GLOVE) ×2
GLOVE BIOGEL PI INDICATOR 8 (GLOVE) ×2
GLOVE ECLIPSE 6.5 STRL STRAW (GLOVE) ×2 IMPLANT
GLOVE ORTHO TXT STRL SZ7.5 (GLOVE) ×2 IMPLANT
GLOVE SURG ORTHO 8.0 STRL STRW (GLOVE) ×2 IMPLANT
GOWN BRE IMP PREV XXLGXLNG (GOWN DISPOSABLE) ×4 IMPLANT
GOWN PREVENTION PLUS XLARGE (GOWN DISPOSABLE) ×6 IMPLANT
GUIDEWARE NON THREAD 1.25X150 (WIRE) ×4
GUIDEWIRE NON THREAD 1.25X150 (WIRE) ×2 IMPLANT
NEEDLE MAYO 6 CRC TAPER PT (NEEDLE) ×2 IMPLANT
NS IRRIG 1000ML POUR BTL (IV SOLUTION) ×2 IMPLANT
PACK ARTHROSCOPY DSU (CUSTOM PROCEDURE TRAY) ×2 IMPLANT
PACK BASIN DAY SURGERY FS (CUSTOM PROCEDURE TRAY) ×2 IMPLANT
PAD CLEANER CAUTERY TIP 5X5 (MISCELLANEOUS) ×1
PENCIL BUTTON HOLSTER BLD 10FT (ELECTRODE) ×2 IMPLANT
SCREW ACUTRAK 2 STD 34MM (Screw) ×2 IMPLANT
SCREW CANN L THRD/36 4.0 (Screw) ×2 IMPLANT
SLEEVE SCD COMPRESS KNEE MED (MISCELLANEOUS) ×2 IMPLANT
SLING ARM FOAM STRAP LRG (SOFTGOODS) IMPLANT
SLING ARM FOAM STRAP MED (SOFTGOODS) IMPLANT
SLING ARM FOAM STRAP XLG (SOFTGOODS) IMPLANT
SLING ARM IMMOBILIZER LRG (SOFTGOODS) IMPLANT
SLING ARM IMMOBILIZER MED (SOFTGOODS) IMPLANT
SPONGE GAUZE 4X4 12PLY (GAUZE/BANDAGES/DRESSINGS) ×2 IMPLANT
SPONGE LAP 18X18 X RAY DECT (DISPOSABLE) ×2 IMPLANT
STRIP CLOSURE SKIN 1/2X4 (GAUZE/BANDAGES/DRESSINGS) IMPLANT
SUCTION FRAZIER TIP 10 FR DISP (SUCTIONS) IMPLANT
SUPPORT WRAP ARM LG (MISCELLANEOUS) IMPLANT
SUT FIBERWIRE #2 38 T-5 BLUE (SUTURE)
SUT MNCRL AB 4-0 PS2 18 (SUTURE) ×4 IMPLANT
SUT RETRIEVER MED (INSTRUMENTS) ×2 IMPLANT
SUT VIC AB 0 CT1 18XCR BRD 8 (SUTURE) ×1 IMPLANT
SUT VIC AB 0 CT1 27 (SUTURE)
SUT VIC AB 0 CT1 27XBRD ANBCTR (SUTURE) IMPLANT
SUT VIC AB 0 CT1 8-18 (SUTURE) ×1
SUT VIC AB 2-0 SH 27 (SUTURE)
SUT VIC AB 2-0 SH 27XBRD (SUTURE) IMPLANT
SUT VICRYL 3-0 CR8 SH (SUTURE) ×4 IMPLANT
SUTURE FIBERWR #2 38 T-5 BLUE (SUTURE) IMPLANT
SYR BULB 3OZ (MISCELLANEOUS) IMPLANT
SYR BULB IRRIGATION 50ML (SYRINGE) ×2 IMPLANT
TAPE STRIPS DRAPE STRL (GAUZE/BANDAGES/DRESSINGS) IMPLANT
TOWEL OR 17X24 6PK STRL BLUE (TOWEL DISPOSABLE) ×2 IMPLANT
YANKAUER SUCT BULB TIP NO VENT (SUCTIONS) ×2 IMPLANT

## 2013-01-30 NOTE — Interval H&P Note (Signed)
History and Physical Interval Note:  01/30/2013 11:11 AM  Alexa Li  has presented today for surgery, with the diagnosis of left proximal humerus fracture  The various methods of treatment have been discussed with the patient and family. After consideration of risks, benefits and other options for treatment, the patient has consented to  Procedure(s): OPEN REDUCTION INTERNAL FIXATION (ORIF) PROXIMAL HUMERUS FRACTURE (Left) as a surgical intervention .  The patient's history has been reviewed, patient examined, no change in status, stable for surgery.  I have reviewed the patient's chart and labs.  Questions were answered to the patient's satisfaction.     Tiyon Sanor P

## 2013-01-30 NOTE — H&P (View-Only) (Signed)
ORTHOPAEDIC CONSULTATION  REQUESTING PHYSICIAN: Stephen Rancour, MD  Chief Complaint: Left shoulder pain, fall  HPI: Alexa Li is a 52 y.o. female who complains of  acute severe left shoulder pain after a fall today. She was trying to chase her dog who was running out into the street. She had acute onset deformity, unable left arm, came in the emergency room. She has a past history of left shoulder arthroscopy with acromioplasty, and had full recovery after that. This is 7 years ago done by Dr. Wainer. She reports some numbness and tingling around the shoulder, as well as going down towards her hand. Pain is rated as severe, denies pain in her head or other locations.  Past Medical History  Diagnosis Date  . Hypertension   . Hyperlipidemia   . Anxiety   . Depression    No past surgical history on file. History   Social History  . Marital Status: Married    Spouse Name: N/A    Number of Children: N/A  . Years of Education: N/A   Social History Main Topics  . Smoking status: Not on file  . Smokeless tobacco: Not on file  . Alcohol Use: Not on file  . Drug Use: Not on file  . Sexually Active: Not on file   Other Topics Concern  . Not on file   Social History Narrative  . No narrative on file   No family history on file. she is a nonsmoker, and her father is living, her mother had died, and has had a past history of cardiac disease. No Known Allergies Prior to Admission medications   Medication Sig Start Date End Date Taking? Authorizing Provider  acyclovir (ZOVIRAX) 400 MG tablet Take 400 mg by mouth daily.   Yes Historical Provider, MD  aspirin EC 81 MG tablet Take 81 mg by mouth daily.   Yes Historical Provider, MD  buPROPion (WELLBUTRIN XL) 300 MG 24 hr tablet Take 300 mg by mouth daily.   Yes Historical Provider, MD  Calcium Carb-Cholecalciferol (CALCIUM + D3) 600-200 MG-UNIT TABS Take 2 tablets by mouth daily.   Yes Historical Provider, MD  celecoxib (CELEBREX)  200 MG capsule Take 200 mg by mouth daily.   Yes Historical Provider, MD  fish oil-omega-3 fatty acids 1000 MG capsule Take 1 g by mouth daily.   Yes Historical Provider, MD  hydrochlorothiazide (MICROZIDE) 12.5 MG capsule Take 12.5 mg by mouth daily.   Yes Historical Provider, MD  Lysine 500 MG TABS Take 1 tablet by mouth daily.   Yes Historical Provider, MD  metoprolol succinate (TOPROL-XL) 100 MG 24 hr tablet Take 50 mg by mouth 2 (two) times daily. Take with or immediately following a meal.   Yes Historical Provider, MD  Multiple Vitamin (MULTIVITAMIN WITH MINERALS) TABS Take 1 tablet by mouth daily.   Yes Historical Provider, MD  QUEtiapine (SEROQUEL XR) 50 MG TB24 Take 50 mg by mouth at bedtime.   Yes Historical Provider, MD  simvastatin (ZOCOR) 5 MG tablet Take 5 mg by mouth at bedtime.   Yes Historical Provider, MD  venlafaxine (EFFEXOR) 75 MG tablet Take 150 mg by mouth.   Yes Historical Provider, MD   Dg Chest 1 View  01/29/2013   *RADIOLOGY REPORT*  Clinical Data: Fall, shoulder pain  CHEST - 1 VIEW  Comparison: None.  Findings: Lungs are clear. No pleural effusion or pneumothorax.  The heart is normal in size.  Anterior left shoulder dislocation, better visualized on shoulder   radiographs.  IMPRESSION: No evidence of acute cardiopulmonary disease.  Anterior left shoulder dislocation.   Original Report Authenticated By: Sriyesh Krishnan, M.D.   Dg Shoulder Left  01/29/2013   *RADIOLOGY REPORT*  Clinical Data: Fall, shoulder pain  LEFT SHOULDER - 2+ VIEW  Comparison: None.  Findings: Anterior inferior left shoulder dislocation.  Associated comminuted fracture of the humeral head with dominant fragment involving the greater tuberosity.  Mild degenerative changes of the acromioclavicular joint.  Visualized left lung is clear.  IMPRESSION: Fracture dislocation involving the left humeral head, as described above.   Original Report Authenticated By: Sriyesh Krishnan, M.D.    Positive ROS: All  other systems have been reviewed and were otherwise negative with the exception of those mentioned in the HPI and as above.  Physical Exam: General: Alert, in mild distress Cardiovascular: No pedal edema, radial pulses intact on the left upper extremity. Respiratory: No cyanosis, no use of accessory musculature GI: No organomegaly, abdomen is soft and non-tender Skin: No lesions in the area of chief complaint, although she does have a abrasion over the left elbow Neurologic: Decreased sensation over her deltoid, all of her fingers do flex extend and abduct and she seems to have sensation intact throughout her hand. Psychiatric: Patient is competent for consent with normal mood and affect Lymphatic: No axillary or cervical lymphadenopathy  MUSCULOSKELETAL: Left shoulder has gross deformity, unable to lift, with indentation and loss of contour of the lateral shoulder.  Assessment: Left proximal humerus fracture dislocation  Plan: This is an acute severe injury, and may have an associated axillary nerve palsy with that. I discussed the options with her, and recommended urgent closed reduction, with postreduction CAT scan to determine the position of the greater tuberosity fragment. She may ultimately need surgical intervention to fix her tuberosity.  The risks benefits and alternatives were discussed to the urgent closed reduction, and she is willing to proceed. We'll do this today under conscious sedation in the emergency room.  She will then be placed in a sling, given pain medications, get postreduction CAT scan, and followup and with me in the office in one week. I may contact the family sooner if, at multiple number [phone] if a determination for surgical intervention is made sooner than her followup.  Preprocedure diagnosis: Left shoulder proximal humerus fracture dislocation  Postprocedure diagnosis: Same  Procedure: Left shoulder closed reduction under conscious  sedation  Procedure details: After informed verbal consent and written consent was obtained obtained from the patient and the family and a timeout was performed propofol was utilized monitored by the anesthesia provider, Dr. Steven rang core, and closed reduction of the left shoulder was performed. This is fairly challenging to get back into place, ultimately forward flexion to 180 with manipulation of the humeral head in the axilla manually was able to achieve satisfactory reduction. A satisfactory clunk was achieved. A sling immobilizer was applied, and the patient awakened, and post reduction CT scan is pending.      Mica Ramdass P, MD Cell [phone]   01/29/2013 12:20 PM    

## 2013-01-30 NOTE — Anesthesia Preprocedure Evaluation (Signed)
Anesthesia Evaluation  Patient identified by MRN, date of birth, ID band Patient awake    Reviewed: Allergy & Precautions, H&P , NPO status , Patient's Chart, lab work & pertinent test results  Airway Mallampati: II  Neck ROM: full    Dental   Pulmonary          Cardiovascular hypertension,     Neuro/Psych    GI/Hepatic   Endo/Other  obese  Renal/GU      Musculoskeletal   Abdominal   Peds  Hematology   Anesthesia Other Findings   Reproductive/Obstetrics                           Anesthesia Physical Anesthesia Plan  ASA: II  Anesthesia Plan: General and Regional   Post-op Pain Management: MAC Combined w/ Regional for Post-op pain   Induction: Intravenous  Airway Management Planned: Oral ETT  Additional Equipment:   Intra-op Plan:   Post-operative Plan: Extubation in OR  Informed Consent: I have reviewed the patients History and Physical, chart, labs and discussed the procedure including the risks, benefits and alternatives for the proposed anesthesia with the patient or authorized representative who has indicated his/her understanding and acceptance.     Plan Discussed with: CRNA, Anesthesiologist and Surgeon  Anesthesia Plan Comments:         Anesthesia Quick Evaluation

## 2013-01-30 NOTE — Transfer of Care (Signed)
Immediate Anesthesia Transfer of Care Note  Patient: Alexa Li  Procedure(s) Performed: Procedure(s): OPEN REDUCTION INTERNAL FIXATION (ORIF) PROXIMAL HUMERUS FRACTURE (Left)  Patient Location: PACU  Anesthesia Type:GA combined with regional for post-op pain  Level of Consciousness: awake, alert , oriented and patient cooperative  Airway & Oxygen Therapy: Patient Spontanous Breathing and Patient connected to face mask oxygen  Post-op Assessment: Report given to PACU RN and Post -op Vital signs reviewed and stable  Post vital signs: Reviewed and stable  Complications: No apparent anesthesia complications

## 2013-01-30 NOTE — Op Note (Signed)
01/30/2013  1:21 PM  PATIENT:  Alexa Li    PRE-OPERATIVE DIAGNOSIS:  Left proximal humerus greater tuberosity fracture  POST-OPERATIVE DIAGNOSIS:  Same  PROCEDURE:  OPEN REDUCTION INTERNAL FIXATION (ORIF) PROXIMAL HUMERUS GREATER TUBEROSITY FRACTURE  SURGEON:  Eulas Post, MD  PHYSICIAN ASSISTANT: Janace Litten, OPA-C, present and scrubbed throughout the case, critical for completion in a timely fashion, and for retraction, instrumentation, and closure.  ANESTHESIA:   General  PREOPERATIVE INDICATIONS:  [name] is a  52 y.o. female who had a left shoulder fracture dislocation. Closed reduction was performed emergently yesterday, and then she elected for surgical management of her displaced tuberosity fracture. She had preoperative axillary nerve palsy.  The risks benefits and alternatives were discussed with the patient preoperatively including but not limited to the risks of infection, bleeding, nerve injury, cardiopulmonary complications, the need for revision surgery, among others, and the patient was willing to proceed.  OPERATIVE IMPLANTS: 4.0 mm cannulated screw x1 from [employer], with a 4.0 mm headless AccuTrack cannulated screw x1 superiorly.  OPERATIVE FINDINGS: Comminuted proximal greater tuberosity fracture with displacement  OPERATIVE PROCEDURE: The patient was brought to the operating room and placed in the supine position. 2 g of Ancef were given. General anesthesia was administered. Regional block was also given. She was placed in the beachchair position, and all bony prominences were padded. Time out was performed. The left upper extremity was prepped and draped in usual sterile fashion. Lateral deltoid splitting approach was performed. Care was taken to protect the axillary nerve, not to go too far distal down the shaft. The fracture site was identified, exposed, and cleaned of bursa and hematoma.  I drilled 2 holes in the C. femoral shaft using a 2 mm  drill bit. I placed 2 #2 FiberWire through these holes, up through the cancellus bone, out the fracture bed, and then pass them up through the rotator cuff. The anterior cuff bone fragment was split into 2 fragments, just posterior to the bicipital tuberosity. I secured these 2 bone pieces together with the single FiberWire by adding an additional through passage in the cuff with the FiberWire.  I then reduced the fragment anatomically, by keying the V in distally, and then held it in position with 2 guidewires. Drilled over a guidewire first laterally, and then placed a cannulated, that was cancellus in nature. This was partially threaded, but using the long threads. I used a headed screw here, in order to protect the lateral cortex, and then I drilled the superior guidewire, just through the cortex, and used the headless compression screw in order to prevent prominence superiorly.  Excellent overall fixation was achieved, and I confirmed satisfactory position using the C-arm. The distal fragment was anatomic, and the proximal segment was even slightly over reduced, however this was fragmented, and so completely accurate control was not possible.  I secured the FiberWire, placing appropriate tension, cut the FiberWire, and placed a final reinforcement Vicryl suture between the 2 bone segments anteriorly.  Final C-arm pictures were taken, and full internal and external rotation was confirmed under live C-arm. I irrigated the wounds copiously, there was no hardware prominence, no intra-articular hardware, and repaired the deltoid split with Vicryl, followed by Vicryl and Monocryl for the skin, with Steri-Strips and sterile gauze. She is placed in an immobilizer. She was awakened and returned to the PACU in stable and satisfactory condition. There were no complications and she tolerated the procedure well.

## 2013-01-30 NOTE — Anesthesia Procedure Notes (Addendum)
Procedure Name: Intubation Date/Time: 01/30/2013 11:24 AM Performed by: Caren Macadam Pre-anesthesia Checklist: Patient identified, Emergency Drugs available, Suction available and Patient being monitored Patient Re-evaluated:Patient Re-evaluated prior to inductionOxygen Delivery Method: Circle System Utilized Preoxygenation: Pre-oxygenation with 100% oxygen Intubation Type: IV induction Ventilation: Mask ventilation without difficulty Laryngoscope Size: Miller and 2 Tube type: Oral Tube size: 8.0 mm Number of attempts: 1 Airway Equipment and Method: stylet and oral airway Placement Confirmation: ETT inserted through vocal cords under direct vision and positive ETCO2 Secured at: 22 cm Tube secured with: Tape Dental Injury: Teeth and Oropharynx as per pre-operative assessment    Anesthesia Regional Block:  Interscalene brachial plexus block  Pre-Anesthetic Checklist: ,, timeout performed, Correct Patient, Correct Site, Correct Laterality, Correct Procedure, Correct Position, site marked, Risks and benefits discussed,  Surgical consent,  Pre-op evaluation,  At surgeon's request and post-op pain management  Laterality: Left  Prep: chloraprep       Needles:  Injection technique: Single-shot  Needle Type: Echogenic Stimulator Needle     Needle Length: 5cm 5 cm Needle Gauge: 22 and 22 G    Additional Needles:  Procedures: ultrasound guided (picture in chart) and nerve stimulator Interscalene brachial plexus block  Nerve Stimulator or Paresthesia:  Response: biceps flexion, 0.45 mA,   Additional Responses:   Narrative:  Start time: 01/30/2013 10:36 AM End time: 01/30/2013 10:44 AM Injection made incrementally with aspirations every 5 mL.  Performed by: Personally  Anesthesiologist: Dr Chaney Malling  Additional Notes: Functioning IV was confirmed and monitors were applied.  A 50mm 22ga Arrow echogenic stimulator needle was used. Sterile prep and drape,hand hygiene and  sterile gloves were used.  Negative aspiration and negative test dose prior to incremental administration of local anesthetic. The patient tolerated the procedure well.  Ultrasound guidance: relevent anatomy identified, needle position confirmed, local anesthetic spread visualized around nerve(s), vascular puncture avoided.  Image printed for medical record.   Interscalene brachial plexus block

## 2013-01-30 NOTE — Anesthesia Postprocedure Evaluation (Signed)
Anesthesia Post Note  Patient: Alexa Li  Procedure(s) Performed: Procedure(s) (LRB): OPEN REDUCTION INTERNAL FIXATION (ORIF) PROXIMAL HUMERUS FRACTURE (Left)  Anesthesia type: General  Patient location: PACU  Post pain: Pain level controlled and Adequate analgesia  Post assessment: Post-op Vital signs reviewed, Patient's Cardiovascular Status Stable, Respiratory Function Stable, Patent Airway and Pain level controlled  Last Vitals:  Filed Vitals:   01/30/13 1415  BP: 120/64  Pulse: 79  Temp:   Resp: 19    Post vital signs: Reviewed and stable  Level of consciousness: awake, alert  and oriented  Complications: No apparent anesthesia complications

## 2013-01-30 NOTE — Progress Notes (Signed)
Assisted Dr. Hodierne with left, ultrasound guided, interscalene  block. Side rails up, monitors on throughout procedure. See vital signs in flow sheet. Tolerated Procedure well. 

## 2013-01-31 ENCOUNTER — Encounter (HOSPITAL_BASED_OUTPATIENT_CLINIC_OR_DEPARTMENT_OTHER): Payer: Self-pay | Admitting: Orthopedic Surgery

## 2013-02-13 ENCOUNTER — Encounter (HOSPITAL_BASED_OUTPATIENT_CLINIC_OR_DEPARTMENT_OTHER): Payer: Self-pay | Admitting: *Deleted

## 2013-02-13 NOTE — Progress Notes (Signed)
Pt here 01/30/13 for this surgery-screw has come loss-had labs 01/29/13-can [facility] if [employer]

## 2013-02-14 ENCOUNTER — Encounter (HOSPITAL_BASED_OUTPATIENT_CLINIC_OR_DEPARTMENT_OTHER): Payer: Self-pay | Admitting: Anesthesiology

## 2013-02-14 ENCOUNTER — Encounter (HOSPITAL_BASED_OUTPATIENT_CLINIC_OR_DEPARTMENT_OTHER): Payer: Self-pay | Admitting: *Deleted

## 2013-02-14 ENCOUNTER — Encounter (HOSPITAL_BASED_OUTPATIENT_CLINIC_OR_DEPARTMENT_OTHER)
Admission: RE | Disposition: A | Discharge status: Home / Self Care | Payer: Self-pay | Source: Ambulatory Visit | Attending: Orthopedic Surgery

## 2013-02-14 ENCOUNTER — Ambulatory Visit (HOSPITAL_BASED_OUTPATIENT_CLINIC_OR_DEPARTMENT_OTHER): Payer: 59 | Admitting: Anesthesiology

## 2013-02-14 ENCOUNTER — Ambulatory Visit (HOSPITAL_BASED_OUTPATIENT_CLINIC_OR_DEPARTMENT_OTHER)
Admission: RE | Admit: 2013-02-14 | Discharge: 2013-02-14 | Disposition: A | Discharge status: Home / Self Care | Payer: 59 | Source: Ambulatory Visit | Attending: Orthopedic Surgery | Admitting: Orthopedic Surgery

## 2013-02-14 ENCOUNTER — Ambulatory Visit (HOSPITAL_COMMUNITY): Payer: 59

## 2013-02-14 DIAGNOSIS — E785 Hyperlipidemia, unspecified: Secondary | ICD-10-CM | POA: Insufficient documentation

## 2013-02-14 DIAGNOSIS — S42252A Displaced fracture of greater tuberosity of left humerus, initial encounter for closed fracture: Secondary | ICD-10-CM

## 2013-02-14 DIAGNOSIS — I10 Essential (primary) hypertension: Secondary | ICD-10-CM | POA: Insufficient documentation

## 2013-02-14 DIAGNOSIS — S42253A Displaced fracture of greater tuberosity of unspecified humerus, initial encounter for closed fracture: Secondary | ICD-10-CM | POA: Insufficient documentation

## 2013-02-14 DIAGNOSIS — Z79899 Other long term (current) drug therapy: Secondary | ICD-10-CM | POA: Insufficient documentation

## 2013-02-14 DIAGNOSIS — Z7982 Long term (current) use of aspirin: Secondary | ICD-10-CM | POA: Insufficient documentation

## 2013-02-14 DIAGNOSIS — Y831 Surgical operation with implant of artificial internal device as the cause of abnormal reaction of the patient, or of later complication, without mention of misadventure at the time of the procedure: Secondary | ICD-10-CM | POA: Insufficient documentation

## 2013-02-14 DIAGNOSIS — T84498A Other mechanical complication of other internal orthopedic devices, implants and grafts, initial encounter: Secondary | ICD-10-CM | POA: Insufficient documentation

## 2013-02-14 DIAGNOSIS — S42252S Displaced fracture of greater tuberosity of left humerus, sequela: Secondary | ICD-10-CM

## 2013-02-14 DIAGNOSIS — F411 Generalized anxiety disorder: Secondary | ICD-10-CM | POA: Insufficient documentation

## 2013-02-14 DIAGNOSIS — F3289 Other specified depressive episodes: Secondary | ICD-10-CM | POA: Insufficient documentation

## 2013-02-14 DIAGNOSIS — F329 Major depressive disorder, single episode, unspecified: Secondary | ICD-10-CM | POA: Insufficient documentation

## 2013-02-14 HISTORY — PX: ORIF SHOULDER FRACTURE: SHX5035

## 2013-02-14 HISTORY — DX: Unspecified osteoarthritis, unspecified site: M19.90

## 2013-02-14 SURGERY — OPEN REDUCTION INTERNAL FIXATION (ORIF) SHOULDER FRACTURE
Anesthesia: Regional | Site: Shoulder | Laterality: Left | Wound class: Clean

## 2013-02-14 MED ORDER — BUPIVACAINE-EPINEPHRINE PF 0.5-1:200000 % IJ SOLN
INTRAMUSCULAR | Status: DC | PRN
  Administered 2013-02-14: 30 mL

## 2013-02-14 MED ORDER — OXYCODONE HCL 5 MG/5ML PO SOLN: 5.0000 mg | Freq: Once | ORAL | Status: DC | PRN

## 2013-02-14 MED ORDER — PROPOFOL 10 MG/ML IV BOLUS
INTRAVENOUS | Status: DC | PRN
  Administered 2013-02-14: 150 mg via INTRAVENOUS

## 2013-02-14 MED ORDER — MIDAZOLAM HCL 2 MG/2ML IJ SOLN
1.0000 mg | INTRAMUSCULAR | Status: DC | PRN
  Administered 2013-02-14: 2 mg via INTRAVENOUS

## 2013-02-14 MED ORDER — OXYCODONE-ACETAMINOPHEN 10-325 MG PO TABS: 1.0000 | ORAL_TABLET | Freq: Four times a day (QID) | ORAL | Status: DC | PRN

## 2013-02-14 MED ORDER — CEFAZOLIN SODIUM-DEXTROSE 2-3 GM-% IV SOLR
2.0000 g | INTRAVENOUS | Status: AC
  Administered 2013-02-14: 2 g via INTRAVENOUS

## 2013-02-14 MED ORDER — MIDAZOLAM HCL 5 MG/5ML IJ SOLN
INTRAMUSCULAR | Status: DC | PRN
  Administered 2013-02-14: 1 mg via INTRAVENOUS

## 2013-02-14 MED ORDER — FENTANYL CITRATE 0.05 MG/ML IJ SOLN
50.0000 ug | INTRAMUSCULAR | Status: DC | PRN
  Administered 2013-02-14: 100 ug via INTRAVENOUS

## 2013-02-14 MED ORDER — ONDANSETRON HCL 4 MG/2ML IJ SOLN
INTRAMUSCULAR | Status: DC | PRN
  Administered 2013-02-14: 4 mg via INTRAVENOUS

## 2013-02-14 MED ORDER — PHENYLEPHRINE HCL 10 MG/ML IJ SOLN
10.0000 mg | INTRAVENOUS | Status: DC | PRN
  Administered 2013-02-14: 40 ug/min via INTRAVENOUS

## 2013-02-14 MED ORDER — METHOCARBAMOL 500 MG PO TABS: 500.0000 mg | ORAL_TABLET | Freq: Four times a day (QID) | ORAL | Status: DC

## 2013-02-14 MED ORDER — DEXAMETHASONE SODIUM PHOSPHATE 4 MG/ML IJ SOLN
INTRAMUSCULAR | Status: DC | PRN
  Administered 2013-02-14: 10 mg via INTRAVENOUS

## 2013-02-14 MED ORDER — HYDROMORPHONE HCL PF 1 MG/ML IJ SOLN: 0.2500 mg | INTRAMUSCULAR | Status: DC | PRN

## 2013-02-14 MED ORDER — SUCCINYLCHOLINE CHLORIDE 20 MG/ML IJ SOLN
INTRAMUSCULAR | Status: DC | PRN
  Administered 2013-02-14: 50 mg via INTRAVENOUS
  Administered 2013-02-14: 100 mg via INTRAVENOUS

## 2013-02-14 MED ORDER — OXYCODONE HCL 5 MG PO TABS: 5.0000 mg | ORAL_TABLET | Freq: Once | ORAL | Status: DC | PRN

## 2013-02-14 MED ORDER — PHENYLEPHRINE HCL 10 MG/ML IJ SOLN
INTRAMUSCULAR | Status: DC | PRN
  Administered 2013-02-14 (×3): 40 ug via INTRAVENOUS

## 2013-02-14 MED ORDER — LACTATED RINGERS IV SOLN
INTRAVENOUS | Status: DC
  Administered 2013-02-14 (×3): via INTRAVENOUS

## 2013-02-14 SURGICAL SUPPLY — 76 items
ANCHOR CORKSCREW BIO 5.5 (Anchor) ×4 IMPLANT
ANCHOR PUSHLOCK PEEK 3.5X19.5 (Anchor) ×2 IMPLANT
BENZOIN TINCTURE PRP APPL 2/3 (GAUZE/BANDAGES/DRESSINGS) ×2 IMPLANT
BLADE AVERAGE 25X9 (BLADE) IMPLANT
BLADE SURG 15 STRL LF DISP TIS (BLADE) ×2 IMPLANT
BLADE SURG 15 STRL SS (BLADE) ×2
BLADE SURG ROTATE 9660 (MISCELLANEOUS) IMPLANT
CANISTER SUCTION 1200CC (MISCELLANEOUS) ×2 IMPLANT
CANISTER SUCTION 2500CC (MISCELLANEOUS) IMPLANT
CHLORAPREP W/TINT 26ML (MISCELLANEOUS) ×2 IMPLANT
CLOTH BEACON ORANGE TIMEOUT ST (SAFETY) ×2 IMPLANT
DECANTER SPIKE VIAL GLASS SM (MISCELLANEOUS) IMPLANT
DRAPE C-ARM 42X72 X-RAY (DRAPES) ×2 IMPLANT
DRAPE INCISE IOBAN 66X45 STRL (DRAPES) ×2 IMPLANT
DRAPE OEC MINIVIEW 54X84 (DRAPES) IMPLANT
DRAPE U 20/CS (DRAPES) ×4 IMPLANT
DRAPE U-SHAPE 47X51 STRL (DRAPES) ×2 IMPLANT
DRAPE U-SHAPE 76X120 STRL (DRAPES) ×4 IMPLANT
DRSG PAD ABDOMINAL 8X10 ST (GAUZE/BANDAGES/DRESSINGS) ×2 IMPLANT
ELECT BLADE 6.5 .24CM SHAFT (ELECTRODE) IMPLANT
ELECT REM PT RETURN 9FT ADLT (ELECTROSURGICAL) ×2
ELECTRODE REM PT RTRN 9FT ADLT (ELECTROSURGICAL) ×1 IMPLANT
FIBERSTICK 2 (SUTURE) IMPLANT
GAUZE SPONGE 4X4 16PLY XRAY LF (GAUZE/BANDAGES/DRESSINGS) IMPLANT
GLOVE BIO SURGEON STRL SZ 6.5 (GLOVE) ×2 IMPLANT
GLOVE BIO SURGEON STRL SZ7 (GLOVE) ×2 IMPLANT
GLOVE BIO SURGEON STRL SZ8 (GLOVE) ×4 IMPLANT
GLOVE BIOGEL PI IND STRL 7.0 (GLOVE) ×2 IMPLANT
GLOVE BIOGEL PI IND STRL 8 (GLOVE) ×2 IMPLANT
GLOVE BIOGEL PI INDICATOR 7.0 (GLOVE) ×2
GLOVE BIOGEL PI INDICATOR 8 (GLOVE) ×2
GLOVE ORTHO TXT STRL SZ7.5 (GLOVE) ×2 IMPLANT
GOWN BRE IMP PREV XXLGXLNG (GOWN DISPOSABLE) ×4 IMPLANT
GOWN PREVENTION PLUS XLARGE (GOWN DISPOSABLE) ×4 IMPLANT
KIT BIO-TENODESIS 3X8 DISP (MISCELLANEOUS)
KIT INSRT BABSR STRL DISP BTN (MISCELLANEOUS) IMPLANT
NEEDLE MINI RC 24MM (NEEDLE) ×2 IMPLANT
NS IRRIG 1000ML POUR BTL (IV SOLUTION) ×2 IMPLANT
PACK ARTHROSCOPY DSU (CUSTOM PROCEDURE TRAY) ×2 IMPLANT
PACK BASIN DAY SURGERY FS (CUSTOM PROCEDURE TRAY) ×2 IMPLANT
PASSER SUT SWANSON 36MM LOOP (INSTRUMENTS) IMPLANT
PENCIL BUTTON HOLSTER BLD 10FT (ELECTRODE) ×2 IMPLANT
PUSHLOCK BIOCOMP 4.5X24 (Orthopedic Implant) ×4 IMPLANT
SHEET MEDIUM DRAPE 40X70 STRL (DRAPES) ×2 IMPLANT
SLEEVE SCD COMPRESS KNEE MED (MISCELLANEOUS) ×2 IMPLANT
SLING ARM FOAM STRAP LRG (SOFTGOODS) IMPLANT
SLING ARM FOAM STRAP MED (SOFTGOODS) IMPLANT
SLING ARM FOAM STRAP XLG (SOFTGOODS) IMPLANT
SLING ARM IMMOBILIZER MED (SOFTGOODS) IMPLANT
SLING ULTRA II LARGE (SOFTGOODS) ×2 IMPLANT
SPONGE GAUZE 4X4 12PLY (GAUZE/BANDAGES/DRESSINGS) ×2 IMPLANT
SPONGE LAP 18X18 X RAY DECT (DISPOSABLE) ×2 IMPLANT
SPONGE LAP 4X18 X RAY DECT (DISPOSABLE) ×2 IMPLANT
STRIP CLOSURE SKIN 1/2X4 (GAUZE/BANDAGES/DRESSINGS) ×2 IMPLANT
SUCTION FRAZIER TIP 10 FR DISP (SUCTIONS) ×2 IMPLANT
SUPPORT WRAP ARM LG (MISCELLANEOUS) ×2 IMPLANT
SUT 2 FIBERLOOP 20 STRT BLUE (SUTURE)
SUT ETHIBOND 2 OS 4 DA (SUTURE) IMPLANT
SUT ETHILON 4 0 PS 2 18 (SUTURE) IMPLANT
SUT FIBERWIRE #2 38 T-5 BLUE (SUTURE) ×2
SUT MNCRL AB 3-0 PS2 18 (SUTURE) IMPLANT
SUT MNCRL AB 4-0 PS2 18 (SUTURE) ×2 IMPLANT
SUT PDS AB 0 CT 36 (SUTURE) IMPLANT
SUT PROLENE 3 0 PS 2 (SUTURE) IMPLANT
SUT VIC AB 0 CT1 18XCR BRD 8 (SUTURE) ×1 IMPLANT
SUT VIC AB 0 CT1 8-18 (SUTURE) ×1
SUT VIC AB 2-0 SH 18 (SUTURE) IMPLANT
SUT VICRYL 3-0 CR8 SH (SUTURE) ×2 IMPLANT
SUTURE 2 FIBERLOOP 20 STRT BLU (SUTURE) IMPLANT
SUTURE FIBERWR #2 38 T-5 BLUE (SUTURE) ×1 IMPLANT
SYR BULB 3OZ (MISCELLANEOUS) ×2 IMPLANT
TAPE FIBER 2MM 7IN #2 BLUE (SUTURE) IMPLANT
TOWEL OR 17X24 6PK STRL BLUE (TOWEL DISPOSABLE) ×2 IMPLANT
TOWEL OR NON WOVEN STRL DISP B (DISPOSABLE) ×2 IMPLANT
TUBE CONNECTING 12X1/4 (SUCTIONS) ×2 IMPLANT
YANKAUER SUCT BULB TIP NO VENT (SUCTIONS) ×2 IMPLANT

## 2013-02-14 NOTE — Anesthesia Preprocedure Evaluation (Addendum)
Anesthesia Evaluation  Patient identified by MRN, date of birth, ID band Patient awake    Reviewed: Allergy & Precautions, H&P , NPO status , Patient's Chart, lab work & pertinent test results, reviewed documented beta blocker date and time   Airway Mallampati: II TM Distance: >3 FB Neck ROM: Full    Dental no notable dental hx. (+) Teeth Intact and Dental Advisory Given   Pulmonary neg pulmonary ROS,  breath sounds clear to auscultation  Pulmonary exam normal       Cardiovascular hypertension, On Medications and On Home Beta Blockers Rhythm:Regular Rate:Normal     Neuro/Psych PSYCHIATRIC DISORDERS negative neurological ROS     GI/Hepatic negative GI ROS, Neg liver ROS,   Endo/Other  negative endocrine ROS  Renal/GU negative Renal ROS  negative genitourinary   Musculoskeletal   Abdominal   Peds  Hematology negative hematology ROS (+)   Anesthesia Other Findings   Reproductive/Obstetrics negative OB ROS                          Anesthesia Physical Anesthesia Plan  ASA: II  Anesthesia Plan: General and Regional   Post-op Pain Management:    Induction: Intravenous  Airway Management Planned: Oral ETT  Additional Equipment:   Intra-op Plan:   Post-operative Plan: Extubation in OR  Informed Consent: I have reviewed the patients History and Physical, chart, labs and discussed the procedure including the risks, benefits and alternatives for the proposed anesthesia with the patient or authorized representative who has indicated his/her understanding and acceptance.   Dental advisory given  Plan Discussed with: CRNA and Surgeon  Anesthesia Plan Comments:         Anesthesia Quick Evaluation

## 2013-02-14 NOTE — Anesthesia Procedure Notes (Addendum)
Anesthesia Regional Block:  Interscalene brachial plexus block  Pre-Anesthetic Checklist: ,, timeout performed, Correct Patient, Correct Site, Correct Laterality, Correct Procedure, Correct Position, site marked, Risks and benefits discussed, pre-op evaluation,  At surgeon's request and post-op pain management  Laterality: Left  Prep: Maximum Sterile Barrier Precautions used and chloraprep       Needles:  Injection technique: Single-shot  Needle Type: Echogenic Stimulator Needle      Needle Gauge: 22 and 22 G    Additional Needles:  Procedures: ultrasound guided (picture in chart) and nerve stimulator Interscalene brachial plexus block  Nerve Stimulator or Paresthesia:  Response: Biceps response, 0.4 mA,   Additional Responses:   Narrative:  Start time: 02/14/2013 9:46 AM End time: 02/14/2013 9:51 AM Injection made incrementally with aspirations every 5 mL. Anesthesiologist: Sampson Goon, MD  Additional Notes: 2% Lidocaine skin wheel.   Interscalene brachial plexus block Procedure Name: Intubation Date/Time: 02/14/2013 10:25 AM Performed by: Zenia Resides D Pre-anesthesia Checklist: Patient identified, Emergency Drugs available, Suction available and Patient being monitored Patient Re-evaluated:Patient Re-evaluated prior to inductionOxygen Delivery Method: Circle System Utilized Preoxygenation: Pre-oxygenation with 100% oxygen Intubation Type: IV induction Ventilation: Mask ventilation without difficulty Laryngoscope Size: Mac and 3 Grade View: Grade I Tube type: Oral Tube size: 7.0 mm Number of attempts: 1 Airway Equipment and Method: stylet,  oral airway and LTA kit utilized Placement Confirmation: ETT inserted through vocal cords under direct vision,  positive ETCO2 and breath sounds checked- equal and bilateral Secured at: 22 cm Tube secured with: Tape Dental Injury: Teeth and Oropharynx as per pre-operative assessment        Narrative:

## 2013-02-14 NOTE — Transfer of Care (Signed)
Immediate Anesthesia Transfer of Care Note  Patient: Alexa Li  Procedure(s) Performed: Procedure(s): OPEN REDUCTION INTERNAL FIXATION (ORIF) SHOULDER FRACTURE (Left)  Patient Location: PACU  Anesthesia Type:General and Regional  Level of Consciousness: awake  Airway & Oxygen Therapy: Patient Spontanous Breathing and Patient connected to face mask oxygen  Post-op Assessment: Report given to PACU RN and Post -op Vital signs reviewed and stable  Post vital signs: Reviewed and stable  Complications: No apparent anesthesia complications

## 2013-02-14 NOTE — Progress Notes (Signed)
Assisted Dr. Fitzgerald with left, ultrasound guided, interscalene  block. Side rails up, monitors on throughout procedure. See vital signs in flow sheet. Tolerated Procedure well. 

## 2013-02-14 NOTE — Anesthesia Postprocedure Evaluation (Signed)
  Anesthesia Post-op Note  Patient: Alexa Li  Procedure(s) Performed: Procedure(s): OPEN REDUCTION INTERNAL FIXATION (ORIF) SHOULDER FRACTURE (Left)  Patient Location: PACU  Anesthesia Type:GA combined with regional for post-op pain  Level of Consciousness: awake, alert  and oriented  Airway and Oxygen Therapy: Patient Spontanous Breathing  Post-op Pain: none  Post-op Assessment: Post-op Vital signs reviewed, Patient's Cardiovascular Status Stable, Respiratory Function Stable, Patent Airway and No signs of Nausea or vomiting  Post-op Vital Signs: Reviewed and stable  Complications: No apparent anesthesia complications

## 2013-02-14 NOTE — H&P (Signed)
PREOPERATIVE H&P  Chief Complaint: Recurrent greater tuberosity fracture  HPI: Alexa Li is a 52 y.o. female who presents for preoperative history and physical with a diagnosis of recurrent greater tuberosity fracture. She had a surgical intervention approximately 2 weeks ago, for open reduction internal fixation of a greater tuberosity fracture, which was present after a glenohumeral dislocation. 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 She presented to the office yesterday, and repeat x-rays demonstrated loss of fixation of the tuberosity repair. She does not know a single major event that happened, although she says that after her surgery, she was letting her arm hanging down, and felt like it slipped down, and gave way.  Past Medical History  Diagnosis Date  . Hypertension   . Hyperlipidemia   . Anxiety   . Depression   . Wears glasses   . Closed displaced fracture of greater tuberosity of left humerus 01/30/2013   Past Surgical History  Procedure Laterality Date  . Shoulder arthroscopy  2006    left  . Tubal ligation  2004  . Orif humerus fracture Left 01/30/2013    Procedure: OPEN REDUCTION INTERNAL FIXATION (ORIF) PROXIMAL HUMERUS FRACTURE;  Surgeon: Eulas Post, MD;  Location: [facility];  Service: Orthopedics;  Laterality: Left;   History   Social History  . Marital Status: Married    Spouse Name: N/A    Number of Children: N/A  . Years of Education: N/A   Social History Main Topics  . Smoking status: Never Smoker   . Smokeless tobacco: None  . Alcohol Use: Yes     Comment: occ  . Drug Use: No  . Sexually Active: None   Other Topics Concern  . None   Social History Narrative  . None   History reviewed. No pertinent family history. No Known Allergies Prior to Admission medications   Medication Sig Start Date End Date Taking? Authorizing Provider  acyclovir (ZOVIRAX) 400 MG tablet Take 400 mg by mouth daily.     Historical Provider, MD  aspirin EC 81 MG tablet Take 81 mg by mouth daily.    Historical Provider, MD  buPROPion (WELLBUTRIN XL) 300 MG 24 hr tablet Take 300 mg by mouth daily.    Historical Provider, MD  Calcium Carb-Cholecalciferol (CALCIUM + D3) 600-200 MG-UNIT TABS Take 2 tablets by mouth daily.    Historical Provider, MD  fish oil-omega-3 fatty acids 1000 MG capsule Take 1 g by mouth daily.    Historical Provider, MD  hydrochlorothiazide (MICROZIDE) 12.5 MG capsule Take 12.5 mg by mouth daily.    Historical Provider, MD  Lysine 500 MG TABS Take 1 tablet by mouth daily.    Historical Provider, MD  methocarbamol (ROBAXIN) 500 MG tablet Take 1 tablet (500 mg total) by mouth 4 (four) times daily. 01/30/13   Eulas Post, MD  metoprolol succinate (TOPROL-XL) 100 MG 24 hr tablet Take 50 mg by mouth 2 (two) times daily. Take with or immediately following a meal.    Historical Provider, MD  Multiple Vitamin (MULTIVITAMIN WITH MINERALS) TABS Take 1 tablet by mouth daily.    Historical Provider, MD  ondansetron (ZOFRAN) 4 MG tablet Take 1 tablet (4 mg total) by mouth every 6 (six) hours. 01/29/13   Glynn Octave, MD  oxyCODONE-acetaminophen (PERCOCET) 10-325 MG per tablet Take 1-2 tablets by mouth every 6 (six) hours as needed for pain. MAXIMUM TOTAL ACETAMINOPHEN DOSE IS 4000 MG PER DAY 01/30/13   Ivin Booty  Robie Ridge, MD  promethazine (PHENERGAN) 25 MG tablet Take 1 tablet (25 mg total) by mouth every 6 (six) hours as needed for nausea. 01/30/13   Eulas Post, MD  QUEtiapine (SEROQUEL XR) 50 MG TB24 Take 50 mg by mouth at bedtime.    Historical Provider, MD  sennosides-docusate sodium (SENOKOT-S) 8.6-50 MG tablet Take 1 tablet by mouth daily. 01/30/13   Eulas Post, MD  simvastatin (ZOCOR) 5 MG tablet Take 5 mg by mouth at bedtime.    Historical Provider, MD  venlafaxine (EFFEXOR) 75 MG tablet Take 150 mg by mouth.    Historical Provider, MD     Positive ROS: All other systems have been reviewed  and were otherwise negative with the exception of those mentioned in the HPI and as above.  Physical Exam: General: Alert, no acute distress Cardiovascular: No pedal edema Respiratory: No cyanosis, no use of accessory musculature GI: No organomegaly, abdomen is soft and non-tender Skin: No lesions in the area of chief complaint Neurologic: Sensation intact distally Psychiatric: Patient is competent for consent with normal mood and affect Lymphatic: No axillary or cervical lymphadenopathy  MUSCULOSKELETAL: Left upper extremity has surgical wounds that are intact and healing well. She has anesthesia over her deltoid. All fingers flex extend and abduct.  X-rays demonstrate loss of fixation of the greater tuberosity fracture.  Assessment: Recurrent left greater tuberosity fracture with loss of fixation, pre-existing axillary nerve palsy  Plan: Plan for Procedure(s): OPEN REDUCTION INTERNAL FIXATION (ORIF) SHOULDER GREATER TUBEROSITY FRACTURE with removal of hardware  The risks benefits and alternatives were discussed with the patient including but not limited to the risks of nonoperative treatment, versus surgical intervention including infection, bleeding, nerve injury,  blood clots, cardiopulmonary complications, morbidity, mortality, among others, and they were willing to proceed. We've also discussed the risks for recurrent loss of fixation, permanent dysfunction of the shoulder, permanent axillary nerve palsy, among others.  Eulas Post, MD Cell [phone]   02/14/2013 7:20 AM

## 2013-02-14 NOTE — Op Note (Addendum)
02/14/2013  12:35 PM  PATIENT:  Alexa Li    PRE-OPERATIVE DIAGNOSIS:  FAILED HARDWARE LEFT SHOULDER with recurrent greater tuberosity fracture and displacement  POST-OPERATIVE DIAGNOSIS:  Same  PROCEDURE:  OPEN REDUCTION INTERNAL FIXATION (ORIF) left greater tuberosity fracture, revision, with removal of hardware  SURGEON:  Eulas Post, MD  PHYSICIAN ASSISTANT: Janace Litten, OPA-C, present and scrubbed throughout the case, critical for completion in a timely fashion, and for retraction, instrumentation, and closure.  ANESTHESIA:   General  PREOPERATIVE INDICATIONS:  Alexa Li is a  52 y.o. female who had a left shoulder fracture dislocation, and had closed reduction, followed by open reduction internal fixation of the greater tuberosity. At her 2 week postoperative followup, her fixation had failed, and she had significant displacement of the tuberosity. She elected for surgical management. She did have a axillary nerve injury from the dislocation that was recognized before the ORIF of the tuberosity, and has had persistent numbness in the axillary nerve distribution laterally.    The risks benefits and alternatives were discussed with the patient preoperatively including but not limited to the risks of infection, bleeding, nerve injury, cardiopulmonary complications, the need for revision surgery, among others, and the patient was willing to proceed. We have also discussed the risks for loss of motion, incomplete return of the axillary nerve, deltoid paralysis, loss of function, among others.  OPERATIVE IMPLANTS: Arthrex bio composite corkscrew fully threaded suture anchors size 5.5 mm x2 with a 4.5 mm bio composite push lock x1 , and a 3.5 mm bio composite push lock anchor x1.  OPERATIVE FINDINGS:  The distal segment of cortical bone had remained, but everything proximal to the lateral head of the screw fractured and displaced. The greater tuberosity was comminuted into  small pieces of bone, and retracted to the level of the glenoid. The superior screw had come out completely from the bone, and was still engaged within the segment of tuberosity. The FiberWire had both broken, and were no longer holding.  OPERATIVE PROCEDURE: The patient is brought to the operating room and placed in the supine position. General anesthesia was administered. IV antibiotics were given. She is placed in the beach chair position. The left upper extremity was prepped and draped in usual sterile fashion. Incision was made through her previous incision. Deep retractors were placed, and the previous deltoid repair was taken down.  The cuff was mobilized, using a Cobb, and the FiberWire sutures removed, and I removed the headless compression screw, and closely inspected the head it 4.0 mm screw. This is really not doing anything at this point, and the lateral cortical segment of bone was completely fragmented, and so I removed this. Once I had gotten back to square 0, I made sure that I had good mobility of the cuff, placed a tag FiberWire stitch, and then a total of 25.5 mm fully threaded anchors 1 anterior and 1 posterior that were double loaded with #2 FiberWire.  I passed all of the limbs up through the cuff, distributing an evenly from front to back. I then tied the sutures down, which provided a medial point of fixation.  I then took one limb from each of the sutures, and brought it anteriorly, and then anchored this with a push lock. I first drilled, then punched, then tapped the anchor. I did need a fair amount of resistance, suspect I was close to being bicortical. Nonetheless I got the anchor to seat completely, and went to the posterior 1.  I did perform the same procedure on the posterior anchor, but I was not able to actually get the punch to go all the way down. Intraoperative x-ray confirmed that the punch was bicortical, or at least reaching the far cortex, and thus necessitating  changing plans, and moving to a shorter anchor. I had a fair amount of interference fit, and therefore used the 3.5 mm push lock anchor, which did provide satisfactory fixation of the posterior lateral row. The fracture fragments had been compressed back down into the bed of the rotator cuff, and I visualized the proximal humerus with live fluoroscopy and rotated through functional range of motion and the tuberosity was found to be stable.  I irrigated the wounds copiously, and repaired the deltoid with Vicryl, followed by Vicryl for the subcutaneous tissue, with Monocryl and Steri-Strips and sterile gauze for the skin. She was awakened and returned to the PACU in stable and satisfactory condition. There were no complications and she tolerated the procedure well.  The axillary nerve was protected throughout the case, and visualized at the distal portion of the incision, and was intact to palpation at completion of the case.

## 2013-02-17 LAB — POCT HEMOGLOBIN-HEMACUE: Hemoglobin: 12.7 g/dL (ref 12.0–15.0)

## 2013-06-06 ENCOUNTER — Ambulatory Visit: Payer: Self-pay | Admitting: Obstetrics & Gynecology

## 2013-06-09 ENCOUNTER — Telehealth: Payer: Self-pay | Admitting: Obstetrics & Gynecology

## 2013-06-09 ENCOUNTER — Ambulatory Visit (INDEPENDENT_AMBULATORY_CARE_PROVIDER_SITE_OTHER): Payer: 59 | Admitting: Obstetrics & Gynecology

## 2013-06-09 ENCOUNTER — Encounter: Payer: Self-pay | Admitting: Obstetrics & Gynecology

## 2013-06-09 VITALS — BP 122/78 | HR 60 | Resp 16 | Ht 66.5 in | Wt 221.0 lb

## 2013-06-09 DIAGNOSIS — Z0000 Encounter for general adult medical examination without abnormal findings: Secondary | ICD-10-CM

## 2013-06-09 DIAGNOSIS — Z01419 Encounter for gynecological examination (general) (routine) without abnormal findings: Secondary | ICD-10-CM

## 2013-06-09 LAB — POCT URINALYSIS DIPSTICK
Blood, UA: NEGATIVE
Protein, UA: NEGATIVE
Urobilinogen, UA: NEGATIVE

## 2013-06-09 MED ORDER — HYDROCORTISONE 2.5 % RE CREA: TOPICAL_CREAM | Freq: Two times a day (BID) | RECTAL | Status: AC

## 2013-06-09 NOTE — Progress Notes (Signed)
52 y.o. G1P1 MarriedCaucasianF here for annual exam.  Really struggled with anxiety this past year.  Has had several deaths/medical problems with family members.  Mother in law died of stroke 02-06-2010.  Father-in-law now in nursing facility by husband's sister (without even talking to husband).  Patient's mother has Alzheimer's and died 09-18-2011.  Then 11-Dec-2022 was in ER at work developed urosepsis.  On antibiotics for 10 days.  Then December, had "breakdown" per patient.  Seeing psychiatrist and therapist.  Also seeing hospice counselor but hasn't gone in over a month.  Seroquel has really helped patient.  She reports she has weaned off several medications.    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  Has been a good change for patient.  Much less stress for patient.  Doing 3 days a week, 8 hrs a days.    Fractured and dislocated left shoulder.  Had surgical repair and then second surgery to removed hardware.  Doing physical therapy.  Patient is left handed and has coped pretty well.    Having about 2 cycles a year right now.  Flow is heavy for about 6-8 hours.  Tapers off quickly and gone by 4 or 5 more days.  Husband has new job with [employer].  Not traveling much and that makes pt very happy because she is seeing husband so much more.  Patient's last menstrual period was 05/23/2013.          Sexually active: yes  The current method of family planning is tubal ligation.    Exercising: no  not regularly Smoker:  no  Health Maintenance: Pap:  05/15/12 WNL/negative HR HPV History of abnormal Pap:  no MMG:  08/21/12 3D normal Colonoscopy:  none BMD:   none TDaP:  01/29/13 Screening Labs: with Dr. Kirtland Bouchard. Shaw, Hb today: 12.4, Urine today: negative   reports that she has never smoked. She has never used smokeless tobacco. She reports that she does not drink alcohol or use illicit drugs.  Past Medical History  Diagnosis Date  .  Hypertension   . Hyperlipidemia   . Anxiety     severe 12/13  . Depression   . Wears glasses   . Closed displaced fracture of greater tuberosity of left humerus 01/30/2013  . Arthritis   . Herpes simplex     Past Surgical History  Procedure Laterality Date  . Shoulder arthroscopy  02/06/2005    left  . Tubal ligation  02/07/2003  . Orif humerus fracture Left 01/30/2013    Procedure: OPEN REDUCTION INTERNAL FIXATION (ORIF) PROXIMAL HUMERUS FRACTURE;  Surgeon: Eulas Post, MD;  Location: [facility];  Service: Orthopedics;  Laterality: Left;  . Orif shoulder fracture Left 02/14/2013    Procedure: OPEN REDUCTION INTERNAL FIXATION (ORIF) SHOULDER FRACTURE;  Surgeon: Eulas Post, MD;  Location: [facility];  Service: Orthopedics;  Laterality: Left;  . Wisdom tooth extraction    . Cesarean section      Current Outpatient Prescriptions  Medication Sig Dispense Refill  . acyclovir (ZOVIRAX) 400 MG tablet Take 400 mg by mouth daily.      Marland Kitchen aspirin EC 81 MG tablet Take 81 mg by mouth daily.      Marland Kitchen buPROPion (WELLBUTRIN XL) 300 MG 24 hr tablet Take 300 mg by mouth daily.      . Calcium Carb-Cholecalciferol (CALCIUM + D3) 600-200 MG-UNIT TABS Take 2 tablets by mouth  daily.      . celecoxib (CELEBREX) 200 MG capsule Take 200 mg by mouth daily.      . fish oil-omega-3 fatty acids 1000 MG capsule Take 1 g by mouth daily.      . hydrochlorothiazide (MICROZIDE) 12.5 MG capsule Take 12.5 mg by mouth daily.      Marland Kitchen LORazepam (ATIVAN) 1 MG tablet Take 1 mg by mouth. TID PRN      . Lysine 500 MG TABS Take 1 tablet by mouth daily.      . metoprolol succinate (TOPROL-XL) 100 MG 24 hr tablet Take 50 mg by mouth 2 (two) times daily. Take with or immediately following a meal.      . Multiple Vitamin (MULTIVITAMIN WITH MINERALS) TABS Take 1 tablet by mouth daily.      . QUEtiapine (SEROQUEL XR) 50 MG TB24 Take 50 mg by mouth at bedtime.      . simvastatin (ZOCOR) 5 MG tablet Take 5 mg  by mouth at bedtime.      Marland Kitchen venlafaxine (EFFEXOR) 75 MG tablet Take 150 mg by mouth.       No current facility-administered medications for this visit.    Family History  Problem Relation Age of Onset  . Hypertension Other     grandparents  . Dementia Mother   . Heart attack Mother   . Heart Problems Maternal Grandmother   . Emphysema Paternal Grandfather   . Thyroid disease Mother   . Hypothyroidism Father     ROS:  Pertinent items are noted in HPI.  Otherwise, a comprehensive ROS was negative.  Exam:   BP 122/78  Pulse 60  Resp 16  Ht 5' 6.5" (1.689 m)  Wt 221 lb (100.245 kg)  BMI 35.14 kg/m2  LMP 05/23/2013  Weight change: +18lbs  Height: 5' 6.5" (168.9 cm)  Ht Readings from Last 3 Encounters:  06/09/13 5' 6.5" (1.689 m)  02/14/13 5\' 7"  (1.702 m)  02/14/13 5\' 7"  (1.702 m)    General appearance: alert, cooperative and appears stated age Head: Normocephalic, without obvious abnormality, atraumatic Neck: no adenopathy, supple, symmetrical, trachea midline and thyroid normal to inspection and palpation Lungs: clear to auscultation bilaterally Breasts: normal appearance, no masses or tenderness Heart: regular rate and rhythm Abdomen: soft, non-tender; bowel sounds normal; no masses,  no organomegaly Extremities: extremities normal, atraumatic, no cyanosis or edema Skin: Skin color, texture, turgor normal. No rashes or lesions Lymph nodes: Cervical, supraclavicular, and axillary nodes normal. No abnormal inguinal nodes palpated Neurologic: Grossly normal   Pelvic: External genitalia:  no lesions              Urethra:  normal appearing urethra with no masses, tenderness or lesions              Bartholins and Skenes: normal                 Vagina: normal appearing vagina with normal color and discharge, no lesions              Cervix: no lesions              Pap taken: no Bimanual Exam:  Uterus:  normal size, contour, position, consistency, mobility, non-tender               Adnexa: normal adnexa and no mass, fullness, tenderness               Rectovaginal: Confirms  Anus:  normal sphincter tone, no lesions  A:  Well Woman with normal exam Perimenopausal Hypertension H/O elevated lipids Mild OAB  P:   Mammogram yearly.  Done 12/13. pap smear with neg HR HPV 8/13.  No pap today. Labs with Dr. Clelia Croft return annually or prn  An After Visit Summary was printed and given to the patient.

## 2013-06-09 NOTE — Telephone Encounter (Signed)
Patient is not sure waht the balance in [place] is for wants to be called an informed. She has not gotten a bill for it.

## 2013-06-09 NOTE — Patient Instructions (Addendum)

## 2013-06-10 LAB — HEMOGLOBIN, FINGERSTICK: Hemoglobin, fingerstick: 12.4 g/dL (ref 12.0–16.0)

## 2013-07-24 ENCOUNTER — Other Ambulatory Visit: Payer: Self-pay

## 2013-08-29 ENCOUNTER — Other Ambulatory Visit: Payer: Self-pay

## 2013-08-29 DIAGNOSIS — Z1231 Encounter for screening mammogram for malignant neoplasm of breast: Secondary | ICD-10-CM

## 2013-10-02 ENCOUNTER — Ambulatory Visit
Admission: RE | Admit: 2013-10-02 | Discharge: 2013-10-02 | Disposition: A | Discharge status: Home / Self Care | Payer: 59 | Source: Ambulatory Visit

## 2013-10-02 DIAGNOSIS — Z1231 Encounter for screening mammogram for malignant neoplasm of breast: Secondary | ICD-10-CM

## 2014-06-02 ENCOUNTER — Encounter: Payer: Self-pay | Admitting: Obstetrics & Gynecology

## 2014-06-02 ENCOUNTER — Telehealth: Payer: Self-pay | Admitting: Obstetrics & Gynecology

## 2014-06-02 NOTE — Telephone Encounter (Signed)
Left message foe patient to call re: apt 06/19/14 with Dr.Miller has been canceled.

## 2014-06-19 ENCOUNTER — Ambulatory Visit: Payer: 59 | Admitting: Obstetrics & Gynecology

## 2014-07-10 ENCOUNTER — Encounter: Payer: Self-pay | Admitting: Nurse Practitioner

## 2014-07-10 ENCOUNTER — Ambulatory Visit: Payer: 59 | Admitting: Nurse Practitioner

## 2014-07-20 ENCOUNTER — Encounter: Payer: Self-pay | Admitting: Obstetrics & Gynecology

## 2014-10-12 ENCOUNTER — Other Ambulatory Visit: Payer: Self-pay | Admitting: Family Medicine

## 2014-10-12 ENCOUNTER — Other Ambulatory Visit (HOSPITAL_COMMUNITY)
Admission: RE | Admit: 2014-10-12 | Discharge: 2014-10-12 | Disposition: A | Discharge status: Home / Self Care | Payer: 59 | Source: Ambulatory Visit | Attending: Family Medicine | Admitting: Family Medicine

## 2014-10-12 DIAGNOSIS — Z124 Encounter for screening for malignant neoplasm of cervix: Secondary | ICD-10-CM | POA: Insufficient documentation

## 2014-10-12 DIAGNOSIS — Z1151 Encounter for screening for human papillomavirus (HPV): Secondary | ICD-10-CM | POA: Insufficient documentation

## 2014-10-14 LAB — CYTOLOGY - PAP

## 2014-12-22 ENCOUNTER — Other Ambulatory Visit: Payer: Self-pay

## 2014-12-22 DIAGNOSIS — Z1231 Encounter for screening mammogram for malignant neoplasm of breast: Secondary | ICD-10-CM

## 2014-12-31 ENCOUNTER — Encounter (INDEPENDENT_AMBULATORY_CARE_PROVIDER_SITE_OTHER): Payer: Self-pay

## 2014-12-31 ENCOUNTER — Ambulatory Visit
Admission: RE | Admit: 2014-12-31 | Discharge: 2014-12-31 | Disposition: A | Discharge status: Home / Self Care | Payer: 59 | Source: Ambulatory Visit

## 2014-12-31 DIAGNOSIS — Z1231 Encounter for screening mammogram for malignant neoplasm of breast: Secondary | ICD-10-CM

## 2015-11-03 DIAGNOSIS — F411 Generalized anxiety disorder: Secondary | ICD-10-CM | POA: Diagnosis not present

## 2015-11-03 DIAGNOSIS — Z0001 Encounter for general adult medical examination with abnormal findings: Secondary | ICD-10-CM | POA: Diagnosis not present

## 2015-11-03 DIAGNOSIS — G4700 Insomnia, unspecified: Secondary | ICD-10-CM | POA: Diagnosis not present

## 2015-11-03 DIAGNOSIS — Z6837 Body mass index (BMI) 37.0-37.9, adult: Secondary | ICD-10-CM | POA: Diagnosis not present

## 2015-11-03 DIAGNOSIS — E7800 Pure hypercholesterolemia, unspecified: Secondary | ICD-10-CM | POA: Diagnosis not present

## 2015-11-03 DIAGNOSIS — I10 Essential (primary) hypertension: Secondary | ICD-10-CM | POA: Diagnosis not present

## 2015-11-03 DIAGNOSIS — F322 Major depressive disorder, single episode, severe without psychotic features: Secondary | ICD-10-CM | POA: Diagnosis not present

## 2015-12-17 ENCOUNTER — Other Ambulatory Visit: Payer: Self-pay

## 2015-12-17 DIAGNOSIS — Z1231 Encounter for screening mammogram for malignant neoplasm of breast: Secondary | ICD-10-CM

## 2016-01-04 ENCOUNTER — Ambulatory Visit
Admission: RE | Admit: 2016-01-04 | Discharge: 2016-01-04 | Disposition: A | Discharge status: Home / Self Care | Payer: 59 | Source: Ambulatory Visit

## 2016-01-04 DIAGNOSIS — Z1231 Encounter for screening mammogram for malignant neoplasm of breast: Secondary | ICD-10-CM

## 2016-09-22 DIAGNOSIS — H04123 Dry eye syndrome of bilateral lacrimal glands: Secondary | ICD-10-CM | POA: Diagnosis not present

## 2016-09-22 DIAGNOSIS — H524 Presbyopia: Secondary | ICD-10-CM | POA: Diagnosis not present

## 2016-09-22 DIAGNOSIS — H2513 Age-related nuclear cataract, bilateral: Secondary | ICD-10-CM | POA: Diagnosis not present

## 2016-09-22 DIAGNOSIS — H52223 Regular astigmatism, bilateral: Secondary | ICD-10-CM | POA: Diagnosis not present

## 2016-11-07 DIAGNOSIS — Z0000 Encounter for general adult medical examination without abnormal findings: Secondary | ICD-10-CM | POA: Diagnosis not present

## 2016-11-07 DIAGNOSIS — F411 Generalized anxiety disorder: Secondary | ICD-10-CM | POA: Diagnosis not present

## 2016-11-07 DIAGNOSIS — Z23 Encounter for immunization: Secondary | ICD-10-CM | POA: Diagnosis not present

## 2016-11-07 DIAGNOSIS — E7800 Pure hypercholesterolemia, unspecified: Secondary | ICD-10-CM | POA: Diagnosis not present

## 2016-11-07 DIAGNOSIS — F322 Major depressive disorder, single episode, severe without psychotic features: Secondary | ICD-10-CM | POA: Diagnosis not present

## 2016-11-07 DIAGNOSIS — I10 Essential (primary) hypertension: Secondary | ICD-10-CM | POA: Diagnosis not present

## 2016-11-07 DIAGNOSIS — G4700 Insomnia, unspecified: Secondary | ICD-10-CM | POA: Diagnosis not present

## 2016-12-26 DIAGNOSIS — F322 Major depressive disorder, single episode, severe without psychotic features: Secondary | ICD-10-CM | POA: Diagnosis not present

## 2017-02-14 ENCOUNTER — Other Ambulatory Visit: Payer: Self-pay | Admitting: Family Medicine

## 2017-02-14 DIAGNOSIS — Z1231 Encounter for screening mammogram for malignant neoplasm of breast: Secondary | ICD-10-CM

## 2017-02-27 ENCOUNTER — Ambulatory Visit
Admission: RE | Admit: 2017-02-27 | Discharge: 2017-02-27 | Disposition: A | Discharge status: Home / Self Care | Payer: PRIVATE HEALTH INSURANCE | Source: Ambulatory Visit | Attending: Family Medicine | Admitting: Family Medicine

## 2017-02-27 DIAGNOSIS — Z1231 Encounter for screening mammogram for malignant neoplasm of breast: Secondary | ICD-10-CM

## 2017-03-03 DIAGNOSIS — M7730 Calcaneal spur, unspecified foot: Secondary | ICD-10-CM | POA: Diagnosis not present

## 2017-03-05 DIAGNOSIS — M25571 Pain in right ankle and joints of right foot: Secondary | ICD-10-CM | POA: Diagnosis not present

## 2017-03-09 DIAGNOSIS — M79671 Pain in right foot: Secondary | ICD-10-CM | POA: Diagnosis not present

## 2017-03-14 DIAGNOSIS — M79671 Pain in right foot: Secondary | ICD-10-CM | POA: Diagnosis not present

## 2017-07-12 DIAGNOSIS — Z23 Encounter for immunization: Secondary | ICD-10-CM | POA: Diagnosis not present

## 2017-10-02 DIAGNOSIS — H04123 Dry eye syndrome of bilateral lacrimal glands: Secondary | ICD-10-CM | POA: Diagnosis not present

## 2017-10-02 DIAGNOSIS — H2513 Age-related nuclear cataract, bilateral: Secondary | ICD-10-CM | POA: Diagnosis not present

## 2017-11-28 DIAGNOSIS — I10 Essential (primary) hypertension: Secondary | ICD-10-CM | POA: Diagnosis not present

## 2017-11-28 DIAGNOSIS — Z0000 Encounter for general adult medical examination without abnormal findings: Secondary | ICD-10-CM | POA: Diagnosis not present

## 2018-04-23 DIAGNOSIS — R42 Dizziness and giddiness: Secondary | ICD-10-CM | POA: Diagnosis not present

## 2018-07-08 DIAGNOSIS — Z23 Encounter for immunization: Secondary | ICD-10-CM | POA: Diagnosis not present

## 2018-11-25 ENCOUNTER — Other Ambulatory Visit: Payer: Self-pay | Admitting: Family Medicine

## 2018-11-25 DIAGNOSIS — Z1231 Encounter for screening mammogram for malignant neoplasm of breast: Secondary | ICD-10-CM

## 2019-01-08 ENCOUNTER — Ambulatory Visit: Payer: PRIVATE HEALTH INSURANCE

## 2019-07-02 ENCOUNTER — Ambulatory Visit: Payer: PRIVATE HEALTH INSURANCE

## 2019-08-19 ENCOUNTER — Ambulatory Visit
Admission: RE | Admit: 2019-08-19 | Discharge: 2019-08-19 | Disposition: A | Discharge status: Home / Self Care | Payer: PRIVATE HEALTH INSURANCE | Source: Ambulatory Visit | Attending: Family Medicine | Admitting: Family Medicine

## 2019-08-19 ENCOUNTER — Other Ambulatory Visit: Payer: Self-pay

## 2019-08-19 DIAGNOSIS — Z1231 Encounter for screening mammogram for malignant neoplasm of breast: Secondary | ICD-10-CM

## 2020-05-31 ENCOUNTER — Emergency Department (HOSPITAL_BASED_OUTPATIENT_CLINIC_OR_DEPARTMENT_OTHER): Payer: 59

## 2020-05-31 ENCOUNTER — Emergency Department (HOSPITAL_BASED_OUTPATIENT_CLINIC_OR_DEPARTMENT_OTHER)
Admission: EM | Admit: 2020-05-31 | Discharge: 2020-05-31 | Disposition: A | Discharge status: Home / Self Care | Payer: 59 | Attending: Emergency Medicine | Admitting: Emergency Medicine

## 2020-05-31 ENCOUNTER — Other Ambulatory Visit: Payer: Self-pay

## 2020-05-31 ENCOUNTER — Encounter (HOSPITAL_BASED_OUTPATIENT_CLINIC_OR_DEPARTMENT_OTHER): Payer: Self-pay | Admitting: *Deleted

## 2020-05-31 DIAGNOSIS — Z7982 Long term (current) use of aspirin: Secondary | ICD-10-CM | POA: Diagnosis not present

## 2020-05-31 DIAGNOSIS — M25551 Pain in right hip: Secondary | ICD-10-CM | POA: Insufficient documentation

## 2020-05-31 DIAGNOSIS — I10 Essential (primary) hypertension: Secondary | ICD-10-CM | POA: Insufficient documentation

## 2020-05-31 DIAGNOSIS — M549 Dorsalgia, unspecified: Secondary | ICD-10-CM | POA: Insufficient documentation

## 2020-05-31 DIAGNOSIS — Z79899 Other long term (current) drug therapy: Secondary | ICD-10-CM | POA: Insufficient documentation

## 2020-05-31 MED ORDER — PREDNISONE 20 MG PO TABS: 20.0000 mg | ORAL_TABLET | Freq: Every day | ORAL | 0 refills | Status: AC

## 2020-05-31 MED ORDER — LIDOCAINE 5 % EX PTCH: 1.0000 | MEDICATED_PATCH | CUTANEOUS | 0 refills | Status: DC

## 2020-05-31 MED ORDER — PREDNISONE 20 MG PO TABS: 20.0000 mg | ORAL_TABLET | Freq: Every day | ORAL | 0 refills | Status: DC

## 2020-05-31 MED ORDER — LIDOCAINE 5 % EX PTCH: 1.0000 | MEDICATED_PATCH | CUTANEOUS | 0 refills | Status: AC

## 2020-05-31 NOTE — Discharge Instructions (Addendum)
Take prednisone as directed.   Please follow up with your primary care provider within 5-7 days for re-evaluation of your symptoms. If you do not have a primary care provider, information for a healthcare clinic has been provided for you to make arrangements for follow up care. Please return to the emergency department for any new or worsening symptoms.  

## 2020-05-31 NOTE — ED Triage Notes (Signed)
Pt c/ right hip pain which radiates down to right knee x 2 days

## 2020-05-31 NOTE — ED Provider Notes (Signed)
[facility] EMERGENCY DEPARTMENT Provider Note   CSN: [id number] Arrival date & time: 05/31/20  1410     History Chief Complaint  Patient presents with  . Hip Pain    Alexa Li is a 59 y.o. female.  HPI   59 year old female with a history of anxiety, arthritis, depression, hyperlipidemia, hypertension, who presents emergency department today for evaluation of right hip pain.  States for the last 2 days she has had pain to the right lateral aspect of her hip.  She does have arthritis in this hip that is typically resolved with Tylenol however she tried this modality and it did not resolve her symptoms.  Pain is constant and severe in nature. It is worse with certain positions and movement. She denies any numbness, weakness, loss control bowel or bladder function.  Denies any fevers, history of IV drug use or cancer.  She felt like her legs looked a bit swollen and she was concerned about a DVT so she came here to the emergency department for further evaluation.  She has an appoint with her PCP in 2 days.  Past Medical History:  Diagnosis Date  . Anxiety    severe 12/13  . Arthritis   . Closed displaced fracture of greater tuberosity of left humerus 01/30/2013  . Depression   . Herpes simplex   . Hyperlipidemia   . Hypertension   . Wears glasses     Patient Active Problem List   Diagnosis Date Noted  . Closed displaced fracture of greater tuberosity of left humerus 01/30/2013    Past Surgical History:  Procedure Laterality Date  . CESAREAN SECTION    . ORIF HUMERUS FRACTURE Left 01/30/2013   Procedure: OPEN REDUCTION INTERNAL FIXATION (ORIF) PROXIMAL HUMERUS FRACTURE;  Surgeon: Eulas Post, MD;  Location: [facility];  Service: Orthopedics;  Laterality: Left;  . ORIF SHOULDER FRACTURE Left 02/14/2013   Procedure: OPEN REDUCTION INTERNAL FIXATION (ORIF) SHOULDER FRACTURE;  Surgeon: Eulas Post, MD;  Location: [facility];   Service: Orthopedics;  Laterality: Left;  . SHOULDER ARTHROSCOPY  2006   left  . TUBAL LIGATION  2004  . WISDOM TOOTH EXTRACTION       OB History    Gravida  1   Para  1   Term      Preterm      AB      Living  1     SAB      TAB      Ectopic      Multiple      Live Births              Family History  Problem Relation Age of Onset  . Hypertension Other        grandparents  . Dementia Mother   . Heart attack Mother   . Thyroid disease Mother   . Heart Problems Maternal Grandmother   . Emphysema Paternal Grandfather   . Hypothyroidism Father     Social History   Tobacco Use  . Smoking status: Never Smoker  . Smokeless tobacco: Never Used  Substance Use Topics  . Alcohol use: No    Comment: occ  . Drug use: No    Home Medications Prior to Admission medications   Medication Sig Start Date End Date Taking? Authorizing Provider  acyclovir (ZOVIRAX) 400 MG tablet Take 400 mg by mouth daily.    [provider]  aspirin EC 81 MG  tablet Take 81 mg by mouth daily.    [provider]  buPROPion (WELLBUTRIN XL) 300 MG 24 hr tablet Take 300 mg by mouth daily.    [provider]  Calcium Carb-Cholecalciferol (CALCIUM + D3) 600-200 MG-UNIT TABS Take 2 tablets by mouth daily.    [provider]  celecoxib (CELEBREX) 200 MG capsule Take 200 mg by mouth daily.    [provider]  cholecalciferol (VITAMIN D) 1000 UNITS tablet Take 1,000 Units by mouth daily.    [provider]  CycloSPORINE (RESTASIS OP) Apply to eye. One drop each eye BID    [provider]  docusate sodium (COLACE) 100 MG capsule Take 100 mg by mouth 2 (two) times daily.    [provider]  fish oil-omega-3 fatty acids 1000 MG capsule Take 1 g by mouth daily.    [provider]  hydrochlorothiazide (MICROZIDE) 12.5 MG capsule Take 12.5 mg by mouth daily.    [provider]  hydrocortisone (ANUSOL-HC) 2.5 %  rectal cream Place rectally 2 (two) times daily. 06/09/13   Jerene Bears, MD  lidocaine (LIDODERM) 5 % Place 1 patch onto the skin daily. Remove & Discard patch within 12 hours or as directed by MD 05/31/20   Elaya Droege S, PA-C  LORazepam (ATIVAN) 1 MG tablet Take 1 mg by mouth. TID PRN    [provider]  Lysine 500 MG TABS Take 1 tablet by mouth daily.    [provider]  metoprolol succinate (TOPROL-XL) 100 MG 24 hr tablet Take 50 mg by mouth 2 (two) times daily. Take with or immediately following a meal.    [provider]  Multiple Vitamin (MULTIVITAMIN WITH MINERALS) TABS Take 1 tablet by mouth daily.    [provider]  Polyethyl Glycol-Propyl Glycol (SYSTANE OP) Apply to eye. One drop each eye QID PRN    [provider]  Polyethylene Glycol 3350 (MIRALAX PO) Take by mouth as needed.    [provider]  predniSONE (DELTASONE) 20 MG tablet Take 1 tablet (20 mg total) by mouth daily for 5 days. 05/31/20 06/05/20  Evadna Donaghy S, PA-C  QUEtiapine (SEROQUEL XR) 50 MG TB24 Take 50 mg by mouth at bedtime.    [provider]  simvastatin (ZOCOR) 5 MG tablet Take 5 mg by mouth at bedtime.    [provider]  venlafaxine (EFFEXOR) 75 MG tablet Take 150 mg by mouth.    [provider]  vitamin C (ASCORBIC ACID) 500 MG tablet Take 500 mg by mouth daily.    [provider]    Allergies    Patient has no known allergies.  Review of Systems   Review of Systems  Constitutional: Negative for fever.  Respiratory: Negative for shortness of breath.   Cardiovascular: Negative for chest pain.  Gastrointestinal: Negative for abdominal pain, blood in stool, constipation, diarrhea, nausea and vomiting.  Genitourinary: Negative for dysuria, flank pain, frequency, hematuria and urgency.  Musculoskeletal: Positive for back pain. Negative for gait problem.       R hip pain  Skin: Negative for wound.  Neurological:  Negative for weakness and numbness.    Physical Exam Updated Vital Signs BP (!) 155/91 (BP Location: Right Arm)   Pulse 78   Temp 98.5 F (36.9 C) (Oral)   Resp 16   Ht 5\' 7"  (1.702 m)   Wt 104.3 kg   LMP 05/23/2013   SpO2 100%   BMI 36.02 kg/m  Physical Exam Vitals and nursing note reviewed.  Constitutional:      General: She is not in acute distress.    Appearance: She is well-developed.  HENT:     Head: Normocephalic and atraumatic.  Eyes:     Conjunctiva/sclera: Conjunctivae normal.  Cardiovascular:     Rate and Rhythm: Normal rate.  Pulmonary:     Effort: Pulmonary effort is normal.  Musculoskeletal:        General: Normal range of motion.     Cervical back: Neck supple.     Comments: No midline lumbar TTP. TTP to the right lateral hip which reproduces pain. No redness/swelling noted to the hip. No appreciable edema to the BLE. No calf TTP.  Skin:    General: Skin is warm and dry.  Neurological:     Mental Status: She is alert.     ED Results / Procedures / Treatments   Labs (all labs ordered are listed, but only abnormal results are displayed) Labs Reviewed - No data to display  EKG None  Radiology US Venous Img Lower Unilateral Right  Result Date: 05/31/2020 CLINICAL DATA:  Acute right lower extremity pain. EXAM: Right LOWER EXTREMITY VENOUS DOPPLER ULTRASOUND TECHNIQUE: Gray-scale sonography with compression, as well as color and duplex ultrasound, were performed to evaluate the deep venous system(s) from the level of the common femoral vein through the popliteal and proximal calf veins. COMPARISON:  None. FINDINGS: VENOUS Normal compressibility of the common femoral, superficial femoral, and popliteal veins, as well as the visualized calf veins. Visualized portions of profunda femoral vein and great saphenous vein unremarkable. No filling defects to suggest DVT on grayscale or color Doppler imaging. Doppler waveforms show normal direction of venous flow,  normal respiratory plasticity and response to augmentation. Limited views of the contralateral common femoral vein are unremarkable. OTHER None. Limitations: none IMPRESSION: Negative. Electronically Signed   By: Lupita Raider M.D.   On: 05/31/2020 14:58   DG Hip Unilat W or Wo Pelvis 2-3 Views Right  Result Date: 05/31/2020 CLINICAL DATA:  Right hip pain X 1 day no injury. EXAM: DG HIP (WITH OR WITHOUT PELVIS) 2-3V RIGHT COMPARISON:  None. FINDINGS: There is no evidence of hip fracture or dislocation. There is no evidence of arthropathy or other focal bone abnormality. IMPRESSION: Negative. Electronically Signed   By: Tish Frederickson M.D.   On: 05/31/2020 15:23    Procedures Procedures (including critical care time)  Medications Ordered in ED Medications - No data to display  ED Course  I have reviewed the triage vital signs and the nursing notes.  Pertinent labs & imaging results that were available during my care of the patient were reviewed by me and considered in my medical decision making (see chart for details).    MDM Rules/Calculators/A&P                          59 year old female presenting for evaluation of right hip pain.  Has history of arthritis but symptoms unresolved with typical Tylenol.  Has tenderness to the right lateral hip joint which raise produces her symptoms.  Reviewed/interpreted x-ray which did not show any acute abnormalities of the hip/pelvis.  Her lower extremity ultrasound was negative for DVT.  Doubt other emergent cause.  Will treat with prednisone.  She is already on Celebrex.  We will also give Lidoderm patches and have her follow-up with her PCP in 2 days.  Advised on strict return  precautions.  She voiced understanding the plan and reasons to return.  All questions answered.  Patient stable for discharge.  Final Clinical Impression(s) / ED Diagnoses Final diagnoses:  Right hip pain    Rx / DC Orders ED Discharge Orders         Ordered     predniSONE (DELTASONE) 20 MG tablet  Daily        05/31/20 1738    lidocaine (LIDODERM) 5 %  Every 24 hours        05/31/20 1738           Roopa Graver, [place] S, PA-C 05/31/20 1747    Sabas SousBero, Michael M, MD 05/31/20 2316

## 2020-07-16 ENCOUNTER — Other Ambulatory Visit: Payer: Self-pay | Admitting: Family Medicine

## 2020-07-16 DIAGNOSIS — Z1231 Encounter for screening mammogram for malignant neoplasm of breast: Secondary | ICD-10-CM

## 2020-08-24 ENCOUNTER — Ambulatory Visit
Admission: RE | Admit: 2020-08-24 | Discharge: 2020-08-24 | Disposition: A | Discharge status: Home / Self Care | Payer: 59 | Source: Ambulatory Visit | Attending: Family Medicine | Admitting: Family Medicine

## 2020-08-24 ENCOUNTER — Other Ambulatory Visit: Payer: Self-pay

## 2020-08-24 DIAGNOSIS — Z1231 Encounter for screening mammogram for malignant neoplasm of breast: Secondary | ICD-10-CM

## 2020-11-12 ENCOUNTER — Encounter (HOSPITAL_COMMUNITY): Payer: Self-pay | Admitting: Emergency Medicine

## 2020-11-12 ENCOUNTER — Emergency Department (HOSPITAL_COMMUNITY)
Admission: EM | Admit: 2020-11-12 | Discharge: 2020-11-12 | Disposition: A | Discharge status: Home / Self Care | Payer: 59 | Attending: Emergency Medicine | Admitting: Emergency Medicine

## 2020-11-12 ENCOUNTER — Emergency Department (HOSPITAL_COMMUNITY): Payer: 59

## 2020-11-12 DIAGNOSIS — Z79899 Other long term (current) drug therapy: Secondary | ICD-10-CM | POA: Insufficient documentation

## 2020-11-12 DIAGNOSIS — S9305XA Dislocation of left ankle joint, initial encounter: Secondary | ICD-10-CM | POA: Insufficient documentation

## 2020-11-12 DIAGNOSIS — Z20822 Contact with and (suspected) exposure to covid-19: Secondary | ICD-10-CM | POA: Diagnosis not present

## 2020-11-12 DIAGNOSIS — W101XXA Fall (on)(from) sidewalk curb, initial encounter: Secondary | ICD-10-CM | POA: Insufficient documentation

## 2020-11-12 DIAGNOSIS — S82892A Other fracture of left lower leg, initial encounter for closed fracture: Secondary | ICD-10-CM

## 2020-11-12 DIAGNOSIS — Z7982 Long term (current) use of aspirin: Secondary | ICD-10-CM | POA: Diagnosis not present

## 2020-11-12 DIAGNOSIS — I10 Essential (primary) hypertension: Secondary | ICD-10-CM | POA: Diagnosis not present

## 2020-11-12 DIAGNOSIS — S99912A Unspecified injury of left ankle, initial encounter: Secondary | ICD-10-CM

## 2020-11-12 LAB — CBC WITH DIFFERENTIAL/PLATELET
Abs Immature Granulocytes: 0.02 10*3/uL (ref 0.00–0.07)
Basophils Absolute: 0 10*3/uL (ref 0.0–0.1)
Basophils Relative: 1 %
Eosinophils Absolute: 0.2 10*3/uL (ref 0.0–0.5)
Eosinophils Relative: 3 %
HCT: 43 % (ref 36.0–46.0)
Hemoglobin: 13.8 g/dL (ref 12.0–15.0)
Immature Granulocytes: 0 %
Lymphocytes Relative: 27 %
Lymphs Abs: 1.6 10*3/uL (ref 0.7–4.0)
MCH: 29.7 pg (ref 26.0–34.0)
MCHC: 32.1 g/dL (ref 30.0–36.0)
MCV: 92.7 fL (ref 80.0–100.0)
Monocytes Absolute: 0.5 10*3/uL (ref 0.1–1.0)
Monocytes Relative: 9 %
Neutro Abs: 3.4 10*3/uL (ref 1.7–7.7)
Neutrophils Relative %: 60 %
Platelets: 236 10*3/uL (ref 150–400)
RBC: 4.64 MIL/uL (ref 3.87–5.11)
RDW: 13 % (ref 11.5–15.5)
WBC: 5.7 10*3/uL (ref 4.0–10.5)
nRBC: 0 % (ref 0.0–0.2)

## 2020-11-12 LAB — COMPREHENSIVE METABOLIC PANEL
ALT: 23 U/L (ref 0–44)
AST: 25 U/L (ref 15–41)
Albumin: 4.4 g/dL (ref 3.5–5.0)
Alkaline Phosphatase: 50 U/L (ref 38–126)
Anion gap: 10 (ref 5–15)
BUN: 18 mg/dL (ref 6–20)
CO2: 28 mmol/L (ref 22–32)
Calcium: 9.8 mg/dL (ref 8.9–10.3)
Chloride: 102 mmol/L (ref 98–111)
Creatinine, Ser: 0.79 mg/dL (ref 0.44–1.00)
GFR, Estimated: >60 mL/min (ref >60)
Glucose, Bld: 93 mg/dL (ref 70–99)
Potassium: 4.2 mmol/L (ref 3.5–5.1)
Sodium: 140 mmol/L (ref 135–145)
Total Bilirubin: 0.9 mg/dL (ref 0.3–1.2)
Total Protein: 7.1 g/dL (ref 6.5–8.1)

## 2020-11-12 MED ORDER — SODIUM CHLORIDE 0.9 % IV BOLUS
1000.0000 mL | Freq: Once | INTRAVENOUS | Status: AC
  Administered 2020-11-12: 1000 mL via INTRAVENOUS

## 2020-11-12 MED ORDER — ONDANSETRON 4 MG PO TBDP: 4.0000 mg | ORAL_TABLET | Freq: Three times a day (TID) | ORAL | 0 refills | Status: AC | PRN

## 2020-11-12 MED ORDER — PROPOFOL 10 MG/ML IV BOLUS
1.0000 mg/kg | Freq: Once | INTRAVENOUS | Status: AC
  Administered 2020-11-12: 60 mg via INTRAVENOUS
  Filled 2020-11-12: qty 20

## 2020-11-12 MED ORDER — OXYCODONE-ACETAMINOPHEN 5-325 MG PO TABS
1.0000 | ORAL_TABLET | Freq: Once | ORAL | Status: AC
  Administered 2020-11-12: 1 via ORAL
  Filled 2020-11-12: qty 1

## 2020-11-12 MED ORDER — HYDROMORPHONE HCL 1 MG/ML IJ SOLN
1.0000 mg | Freq: Once | INTRAMUSCULAR | Status: AC
  Administered 2020-11-12: 1 mg via INTRAVENOUS
  Filled 2020-11-12: qty 1

## 2020-11-12 MED ORDER — ONDANSETRON HCL 4 MG/2ML IJ SOLN
4.0000 mg | Freq: Once | INTRAMUSCULAR | Status: AC
  Administered 2020-11-12: 4 mg via INTRAVENOUS
  Filled 2020-11-12: qty 2

## 2020-11-12 MED ORDER — OXYCODONE-ACETAMINOPHEN 5-325 MG PO TABS: 1.0000 | ORAL_TABLET | ORAL | 0 refills | Status: AC | PRN

## 2020-11-12 NOTE — Progress Notes (Signed)
Orthopedic Tech Progress Note Patient Details:  Alexa Li 1961/01/09 [id number]  Ortho Devices Type of Ortho Device: Ace wrap,Stirrup splint,Post (short) splint Splint Material: Plaster Ortho Device/Splint Location: LLE Ortho Device/Splint Interventions: Ordered,Application   Post Interventions Patient Tolerated: Well Instructions Provided: Care of device   [name] 11/12/2020, 1:58 PM

## 2020-11-12 NOTE — ED Triage Notes (Signed)
Patient was stepping off the curb and states she broke her L ankle. Fell to her knees and caught herself with her arms. EMS gave 100 mcg Fentanyl IV.

## 2020-11-12 NOTE — ED Notes (Signed)
Given PO food and fluids.  

## 2020-11-12 NOTE — ED Provider Notes (Signed)
[facility]-EMERGENCY DEPT Provider Note   CSN: [id number] Arrival date & time: 11/12/20  1200     History Chief Complaint  Patient presents with  . Ankle Injury    Alexa Li is a 60 y.o. female past med history of hypertension, hyperlipidemia brought in by EMS for evaluation of left ankle pain.  Patient reports that she was stepping off the curb to get to her car and states that she missed it, causing her to fall.  She reports that she thinks she twisted her ankle but is unsure of exactly what happened.  She reports falling to her knees and caught herself with her arms.  Did not hit her head.  She reports since then, she has not been able to bear any weight or ambulate on the leg.  She denies any numbness/weakness.  She was given 100 mcg of fentanyl in route with minimal improvement in her pain.  The history is provided by the patient.       Past Medical History:  Diagnosis Date  . Anxiety    severe 12/13  . Arthritis   . Closed displaced fracture of greater tuberosity of left humerus 01/30/2013  . Depression   . Herpes simplex   . Hyperlipidemia   . Hypertension   . Wears glasses     Patient Active Problem List   Diagnosis Date Noted  . Closed displaced fracture of greater tuberosity of left humerus 01/30/2013    Past Surgical History:  Procedure Laterality Date  . CESAREAN SECTION    . ORIF HUMERUS FRACTURE Left 01/30/2013   Procedure: OPEN REDUCTION INTERNAL FIXATION (ORIF) PROXIMAL HUMERUS FRACTURE;  Surgeon: Eulas Post, MD;  Location: [facility];  Service: Orthopedics;  Laterality: Left;  . ORIF SHOULDER FRACTURE Left 02/14/2013   Procedure: OPEN REDUCTION INTERNAL FIXATION (ORIF) SHOULDER FRACTURE;  Surgeon: Eulas Post, MD;  Location: [facility];  Service: Orthopedics;  Laterality: Left;  . SHOULDER ARTHROSCOPY  2006   left  . TUBAL LIGATION  2004  . WISDOM TOOTH EXTRACTION       OB History     Gravida  1   Para  1   Term      Preterm      AB      Living  1     SAB      IAB      Ectopic      Multiple      Live Births              Family History  Problem Relation Age of Onset  . Hypertension Other        grandparents  . Dementia Mother   . Heart attack Mother   . Thyroid disease Mother   . Heart Problems Maternal Grandmother   . Emphysema Paternal Grandfather   . Hypothyroidism Father     Social History   Tobacco Use  . Smoking status: Never Smoker  . Smokeless tobacco: Never Used  Substance Use Topics  . Alcohol use: No    Comment: occ  . Drug use: No    Home Medications Prior to Admission medications   Medication Sig Start Date End Date Taking? Authorizing Provider  ondansetron (ZOFRAN ODT) 4 MG disintegrating tablet Take 1 tablet (4 mg total) by mouth every 8 (eight) hours as needed for nausea or vomiting. 11/12/20  Yes Maxwell Caul, PA-C  oxyCODONE-acetaminophen (PERCOCET/ROXICET) 5-325 MG tablet Take 1-2  tablets by mouth every 4 (four) hours as needed for severe pain. 11/12/20  Yes Maxwell CaulLayden, Lindsey A, PA-C  acyclovir (ZOVIRAX) 400 MG tablet Take 400 mg by mouth daily.    [provider]  aspirin EC 81 MG tablet Take 81 mg by mouth daily.    [provider]  buPROPion (WELLBUTRIN XL) 300 MG 24 hr tablet Take 300 mg by mouth daily.    [provider]  Calcium Carb-Cholecalciferol (CALCIUM + D3) 600-200 MG-UNIT TABS Take 2 tablets by mouth daily.    [provider]  celecoxib (CELEBREX) 200 MG capsule Take 200 mg by mouth daily.    [provider]  cholecalciferol (VITAMIN D) 1000 UNITS tablet Take 1,000 Units by mouth daily.    [provider]  CycloSPORINE (RESTASIS OP) Apply to eye. One drop each eye BID    [provider]  docusate sodium (COLACE) 100 MG capsule Take 100 mg by mouth 2 (two) times daily.    [provider]  fish oil-omega-3 fatty acids 1000 MG  capsule Take 1 g by mouth daily.    [provider]  hydrochlorothiazide (MICROZIDE) 12.5 MG capsule Take 12.5 mg by mouth daily.    [provider]  hydrocortisone (ANUSOL-HC) 2.5 % rectal cream Place rectally 2 (two) times daily. 06/09/13   Jerene BearsMiller, Mary S, MD  lidocaine (LIDODERM) 5 % Place 1 patch onto the skin daily. Remove & Discard patch within 12 hours or as directed by MD 05/31/20   Couture, Cortni S, PA-C  LORazepam (ATIVAN) 1 MG tablet Take 1 mg by mouth. TID PRN    [provider]  Lysine 500 MG TABS Take 1 tablet by mouth daily.    [provider]  metoprolol succinate (TOPROL-XL) 100 MG 24 hr tablet Take 50 mg by mouth 2 (two) times daily. Take with or immediately following a meal.    [provider]  Multiple Vitamin (MULTIVITAMIN WITH MINERALS) TABS Take 1 tablet by mouth daily.    [provider]  Polyethyl Glycol-Propyl Glycol (SYSTANE OP) Apply to eye. One drop each eye QID PRN    [provider]  Polyethylene Glycol 3350 (MIRALAX PO) Take by mouth as needed.    [provider]  QUEtiapine (SEROQUEL XR) 50 MG TB24 Take 50 mg by mouth at bedtime.    [provider]  simvastatin (ZOCOR) 5 MG tablet Take 5 mg by mouth at bedtime.    [provider]  venlafaxine (EFFEXOR) 75 MG tablet Take 150 mg by mouth.    [provider]  vitamin C (ASCORBIC ACID) 500 MG tablet Take 500 mg by mouth daily.    [provider]    Allergies    Patient has no known allergies.  Review of Systems   Review of Systems  Musculoskeletal:       Ankle pain  Neurological: Negative for weakness and numbness.  All other systems reviewed and are negative.   Physical Exam Updated Vital Signs BP 114/72   Pulse 69   Temp 98.5 F (36.9 C) (Oral)   Resp 14   Ht 5\' 7"  (1.702 m)   Wt 105 kg   LMP 05/23/2013   SpO2 94%   BMI 36.26 kg/m   Physical Exam Vitals and nursing note reviewed.   Constitutional:      Appearance: She is well-developed and well-nourished.  HENT:     Head: Normocephalic and atraumatic.  Eyes:     General:  No scleral icterus.       Right eye: No discharge.        Left eye: No discharge.     Extraocular Movements: EOM normal.     Conjunctiva/sclera: Conjunctivae normal.  Cardiovascular:     Pulses:          Dorsalis pedis pulses are 2+ on the right side and 2+ on the left side.  Pulmonary:     Effort: Pulmonary effort is normal.  Musculoskeletal:     Comments: Tenderness palpation noted to left ankle with deformity noted.  Soft tissue swelling noted to the medial malleolus.  No tenderness noted to the tib-fib, knee, thigh.  No tenderness palpation noted to right lower extremity. Compartments soft.   Skin:    General: Skin is warm and dry.          Comments: Good distal cap refill. LLE is not dusky in appearance or cool to touch. No skin tenting.  There was a small 0.5 cm superficial abrasion to the dorsal dorsal aspect of the ankle.  Neurological:     Mental Status: She is alert.     Comments: Sensation intact along major nerve distributions of BLE  Psychiatric:        Mood and Affect: Mood and affect normal.        Speech: Speech normal.        Behavior: Behavior normal.     ED Results / Procedures / Treatments   Labs (all labs ordered are listed, but only abnormal results are displayed) Labs Reviewed  SARS CORONAVIRUS 2 (TAT 6-24 HRS)  CBC WITH DIFFERENTIAL/PLATELET  COMPREHENSIVE METABOLIC PANEL    EKG None  Radiology DG Ankle Complete Left  Result Date: 11/12/2020 CLINICAL DATA:  Status post LEFT ankle reduction. Thick cast on LEFT ankle. EXAM: LEFT ANKLE COMPLETE - 3+ VIEW COMPARISON:  11/12/2020 FINDINGS: Interval reduction of LEFT fracture dislocation. Alignment is near anatomic with mortise reduced. IMPRESSION: Interval reduction of LEFT ankle fracture dislocation. Electronically Signed   By: Norva Pavlov M.D.   On:  11/12/2020 14:19   DG Ankle Complete Left  Result Date: 11/12/2020 CLINICAL DATA:  Left ankle deformity EXAM: LEFT ANKLE COMPLETE - 3+ VIEW COMPARISON:  None. FINDINGS: Complex fracture-dislocation of the left ankle. Oblique distal fibular diaphyseal fracture with anterior apex angulation. Disruption of the distal tibiofibular joint and mild widening of the lateral clear space. Acute fracture through the base of the medial malleolus with approximately 3.0 cm of displacement. 2.0 cm bony fragment adjacent to the lateral aspect of the tibial plafond and may reflect avulsion fracture related to distal tibiofibular joint injury. Suspect tiny avulsion fracture of the posterior malleolus. Talar dome is posterolaterally dislocated relative to the tibial plafond. Subtalar and midfoot alignment appear maintained. Diffuse soft tissue swelling at the fracture site. IMPRESSION: Complex fracture-dislocation of the left ankle, as described above. Electronically Signed   By: Duanne Guess D.O.   On: 11/12/2020 12:28    Procedures Reduction of dislocation  Date/Time: 11/12/2020 2:10 PM Performed by: Maxwell Caul, PA-C Authorized by: Maxwell Caul, PA-C  Consent: Verbal consent obtained. Risks and benefits: risks, benefits and alternatives were discussed Consent given by: patient Patient understanding: patient states understanding of the procedure being performed Patient consent: the patient's understanding of the procedure matches consent given Procedure consent: procedure consent matches procedure scheduled Relevant documents: relevant documents present and verified Test results: test results available and properly labeled Site marked: the operative site was  marked Imaging studies: imaging studies available Required items: required blood products, implants, devices, and special equipment available Patient identity confirmed: verbally with patient Patient tolerance: patient tolerated the  procedure well with no immediate complications Comments: Patient with good distal pulses, cap refill after reduction.  Reevaluation after splint placement shows good cap refill, sensation.      Medications Ordered in ED Medications  oxyCODONE-acetaminophen (PERCOCET/ROXICET) 5-325 MG per tablet 1 tablet (has no administration in time range)  ondansetron (ZOFRAN) injection 4 mg (4 mg Intravenous Given 11/12/20 1224)  sodium chloride 0.9 % bolus 1,000 mL (0 mLs Intravenous Stopped 11/12/20 1448)  HYDROmorphone (DILAUDID) injection 1 mg (1 mg Intravenous Given 11/12/20 1224)  propofol (DIPRIVAN) 10 mg/mL bolus/IV push 105 mg (60 mg Intravenous Given 11/12/20 1341)  HYDROmorphone (DILAUDID) injection 1 mg (1 mg Intravenous Given 11/12/20 1358)  ondansetron (ZOFRAN) injection 4 mg (4 mg Intravenous Given 11/12/20 1358)    ED Course  I have reviewed the triage vital signs and the nursing notes.  Pertinent labs & imaging results that were available during my care of the patient were reviewed by me and considered in my medical decision making (see chart for details).  Clinical Course as of 11/12/20 1522  Fri Nov 12, 2020  1216 I was directly involved in this patients medical care.  [JH]    Clinical Course User Index [JH] Cheryll Cockayne, MD   MDM Rules/Calculators/A&P                          60 year old female who presents for evaluation of left ankle pain after mechanical fall that occurred just prior to ED arrival.  Since then has not been able to bear weight or ambulate.  On initial arrival, she is afebrile, appears uncomfortable but no acute distress.  She is neurovascularly intact.  Left ankle with swelling, deformity noted.  Concern for fracture versus dislocation.  She has a small 0.5 cm abrasion noted to the dorsal aspect of the ankle.  This was cleaned and evaluated with a sterile Q-tip which showed no evidence of puncture wound.  The abrasion was superficial and did not extend deeper.   Exam not concerning for open fracture.  X-ray shows a complex fracture dislocation of the left ankle.  There is a fibular fracture noted with some angulation and disruption of the distal tibial fibular joint and mild widening of the lateral clear space.  She also has a fracture through the base of the medial malleolus.  Discussed patient with Dr. Dion Saucier (Ortho).  He recommends urgent conscious sedation here in the ED with plaster splint that is well-padded.  He would like a post reduction x-ray as well as CT scan.  Reduction performed as documented above.  Patient tolerated procedure well.  Discussed with Dr. Shelba Flake PA who reevaluated post reduction films.  She recommends obtaining a CT of the ankle with plans to have patient follow-up in the office on Monday.  Reevaluation.  Patient with good distal cap refill, sensation.  She is hemodynamically stable.  Patient instructed to follow-up with Dr. Shelba Flake office. At this time, patient exhibits no emergent life-threatening condition that require further evaluation in ED. Patient had ample opportunity for questions and discussion. All patient's questions were answered with full understanding. Strict return precautions discussed. Patient expresses understanding and agreement to plan.   Portions of this note were generated with Scientist, clinical (histocompatibility and immunogenetics). Dictation errors may occur despite best attempts at proofreading.  Final Clinical Impression(s) / ED Diagnoses Final diagnoses:  Ankle injury, left, initial encounter  Closed fracture of left ankle, initial encounter    Rx / DC Orders ED Discharge Orders         Ordered    oxyCODONE-acetaminophen (PERCOCET/ROXICET) 5-325 MG tablet  Every 4 hours PRN        11/12/20 1517    ondansetron (ZOFRAN ODT) 4 MG disintegrating tablet  Every 8 hours PRN        11/12/20 1517           Rosana Hoes 11/12/20 1522    Cheryll Cockayne, MD 11/12/20 1524

## 2020-11-12 NOTE — ED Provider Notes (Signed)
I personally saw and evaluated this patient myself as well.  Patient presents with a left ankle trimalleolar fracture with intact cells pedis pulses. Orthopedic consultation made. Ortho recommends outpatient follow-up with splinting here in the ER.  .Sedation  Date/Time: 11/12/2020 1:48 PM Performed by: Cheryll Cockayne, MD Authorized by: Cheryll Cockayne, MD   Universal protocol:    Immediately prior to procedure, a time out was called: yes   Indications:    Procedure performed:  Dislocation reduction Pre-sedation assessment:    Time since last food or drink:  9:30 AM   NPO status caution: urgency dictates proceeding with non-ideal NPO status     ASA classification: class 2 - patient with mild systemic disease     Mallampati score:  II - soft palate, uvula, fauces visible   Pre-sedation assessments completed and reviewed: airway patency, mental status, nausea/vomiting, respiratory function and temperature   Immediate pre-procedure details:    Reviewed: vital signs     Verified: bag valve mask available, emergency equipment available, intubation equipment available, IV patency confirmed, oxygen available and suction available   Procedure details (see MAR for exact dosages):    Total Provider sedation time (minutes):  12 Comments:     Patient sedated with 50 mg of propofol, given in 20 mg aliquots at a time. Patient had mild desaturation to 88% improved with 3 L nasal cannula to 97%. RT and nursing present throughout sedation. [facility]  Date/Time: 11/12/2020 1:49 PM Performed by: Cheryll Cockayne, MD Authorized by: Cheryll Cockayne, MD  Comments: Left lower extremity short leg splint placed, and inspected by myself. Patient neurovascularly intact after placement.       Cheryll Cockayne, MD 11/12/20 1351

## 2020-11-12 NOTE — Consult Note (Signed)
ORTHOPAEDIC CONSULTATION  REQUESTING PHYSICIAN: Cheryll Cockayne, MD  Chief Complaint: Left ankle pain  HPI: Alexa Li is a 60 y.o. female with history of anxiety, depression, hip osteoarthritis, hypertension, hyperlipidemia who complains of left ankle pain after a fall.  She states her she was taking her father to physical therapy when she fell down onto her hands and knees. She had immediate severe left ankle pain.  EMS was called and she was brought to the emergency department. On arrival to the ED x-rays of the left ankle were taken showing a left ankle fracture dislocation.  Reduction under conscious sedation was performed by ER providers and ankle was splinted.  After reduction patient states left ankle pain is located diffusely and rates an 8 out of 10. Pain is worse with any movement.  Denies any numbness or tingling in her lower extremity.  No previous injury to this ankle.  Past Medical History:  Diagnosis Date  . Anxiety    severe 12/13  . Arthritis   . Closed displaced fracture of greater tuberosity of left humerus 01/30/2013  . Depression   . Herpes simplex   . Hyperlipidemia   . Hypertension   . Wears glasses    Past Surgical History:  Procedure Laterality Date  . CESAREAN SECTION    . ORIF HUMERUS FRACTURE Left 01/30/2013   Procedure: OPEN REDUCTION INTERNAL FIXATION (ORIF) PROXIMAL HUMERUS FRACTURE;  Surgeon: Eulas Post, MD;  Location: [facility];  Service: Orthopedics;  Laterality: Left;  . ORIF SHOULDER FRACTURE Left 02/14/2013   Procedure: OPEN REDUCTION INTERNAL FIXATION (ORIF) SHOULDER FRACTURE;  Surgeon: Eulas Post, MD;  Location: [facility];  Service: Orthopedics;  Laterality: Left;  . SHOULDER ARTHROSCOPY  2006   left  . TUBAL LIGATION  2004  . WISDOM TOOTH EXTRACTION     Social History   Socioeconomic History  . Marital status: Married    Spouse name: Not on file  . Number of children: Not on file  . Years of  education: Not on file  . Highest education level: Not on file  Occupational History  . Not on file  Tobacco Use  . Smoking status: Never Smoker  . Smokeless tobacco: Never Used  Substance and Sexual Activity  . Alcohol use: No    Comment: occ  . Drug use: No  . Sexual activity: Yes    Partners: Male    Birth control/protection: Surgical    Comment: BTL  Other Topics Concern  . Not on file  Social History Narrative  . Not on file   Social Determinants of Health   Financial Resource Strain: Not on file  Food Insecurity: Not on file  Transportation Needs: Not on file  Physical Activity: Not on file  Stress: Not on file  Social Connections: Not on file   Family History  Problem Relation Age of Onset  . Hypertension Other        grandparents  . Dementia Mother   . Heart attack Mother   . Thyroid disease Mother   . Heart Problems Maternal Grandmother   . Emphysema Paternal Grandfather   . Hypothyroidism Father    No Known Allergies   Positive ROS: All other systems have been reviewed and were otherwise negative with the exception of those mentioned in the HPI and as above.  Physical Exam: General: Alert, no acute distress Cardiovascular: No pre tibial edema.  Respiratory: No cyanosis, no use of accessory musculature GI:  No organomegaly, abdomen is soft and non-tender Skin: Patient splinted. Per report from ED providers, no pressure wounds. She had a small superficial abrasian but this was explored and no signs of open fracture. Neurologic: Sensation intact distally Psychiatric: Patient is competent for consent with normal mood and affect Lymphatic: No axillary or cervical lymphadenopathy  MUSCULOSKELETAL: Splint intact. Able to wiggle all toes of left foot. Good capillary refill. Calf compressible.    Assessment/Plan: Left ankle fracture dislocation: - reduced by ED providers with post operative x-rays showing appropriate reduction of dislocation, patient is NVI  post-reduction, CT of left ankle ordered for preoperative planning  - Patient should remain non weight bearing on LLE, with plan to elevate leg at home and return to our office on Monday where we will discuss ORIF. Discussed this with patient and husband and they are aware and amenable to plan. Patient will receive Covid test today with anticipation of surgery next week.   Armida Sans, PA-C  11/12/2020 2:24 PM

## 2020-11-12 NOTE — ED Notes (Signed)
Conscious sedation performed with respiratory, ortho tech, [place], Md and [place], . Time out at 1325. 1326 30 mg propofol given per verbal from [place], MD. 1328 20 mg propofol given IV per verbal from [place], MD. 1329 10 mg propofol given IV per verbal from [place], MD. Relocation at 1329, placed on 4L O2 via [place] for 88% RA oxygen, recovered to 97%. Another 10 mg propofol IV given per verbal from [place], MD for discomfort with splinting. Ankle x-ray re-ordered for placement verification.

## 2020-11-12 NOTE — Discharge Instructions (Signed)
You can take Tylenol or Ibuprofen as directed for pain. You can alternate Tylenol and Ibuprofen every 4 hours. If you take Tylenol at 1pm, then you can take Ibuprofen at 5pm. Then you can take Tylenol again at 9pm.   Take pain medications as directed for break through pain. Do not drive or operate machinery while taking this medication.   Take zofran for nausea.  Call Dr. Shelba Flake office to arrange follow up.  Keep the leg elevated.   Return to the Emergency Dept for any worsening pain, numbness/weakness, discoloration of your toes or any other worsening or concerning symptoms.

## 2020-11-13 LAB — SARS CORONAVIRUS 2 (TAT 6-24 HRS): SARS Coronavirus 2: NEGATIVE

## 2020-11-15 ENCOUNTER — Encounter (HOSPITAL_BASED_OUTPATIENT_CLINIC_OR_DEPARTMENT_OTHER): Payer: Self-pay | Admitting: Orthopedic Surgery

## 2020-11-15 ENCOUNTER — Other Ambulatory Visit: Payer: Self-pay

## 2020-11-15 NOTE — Anesthesia Preprocedure Evaluation (Addendum)
Anesthesia Evaluation  Patient identified by MRN, date of birth, ID band Patient awake    Reviewed: Allergy & Precautions, NPO status , Patient's Chart, lab work & pertinent test results  Airway Mallampati: II  TM Distance: >3 FB Neck ROM: Full    Dental no notable dental hx. (+) Teeth Intact, Dental Advisory Given   Pulmonary sleep apnea ,    Pulmonary exam normal breath sounds clear to auscultation       Cardiovascular hypertension, Pt. on medications Normal cardiovascular exam Rhythm:Regular Rate:Normal     Neuro/Psych PSYCHIATRIC DISORDERS Anxiety negative neurological ROS     GI/Hepatic negative GI ROS, Neg liver ROS,   Endo/Other  negative endocrine ROS  Renal/GU negative Renal ROS     Musculoskeletal  (+) Arthritis ,   Abdominal (+) + obese,   Peds  Hematology negative hematology ROS (+)   Anesthesia Other Findings   Reproductive/Obstetrics                            Anesthesia Physical Anesthesia Plan  ASA: II  Anesthesia Plan: Regional   Post-op Pain Management:    Induction:   PONV Risk Score and Plan: 3 and Midazolam, Ondansetron and Treatment may vary due to age or medical condition  Airway Management Planned: Natural Airway  Additional Equipment: None  Intra-op Plan:   Post-operative Plan:   Informed Consent:     Dental advisory given  Plan Discussed with: CRNA and Anesthesiologist  Anesthesia Plan Comments: (L popliteal w L adductor )       Anesthesia Quick Evaluation

## 2020-11-16 ENCOUNTER — Ambulatory Visit (HOSPITAL_BASED_OUTPATIENT_CLINIC_OR_DEPARTMENT_OTHER): Payer: 59 | Admitting: Anesthesiology

## 2020-11-16 ENCOUNTER — Encounter (HOSPITAL_BASED_OUTPATIENT_CLINIC_OR_DEPARTMENT_OTHER): Payer: Self-pay | Admitting: Orthopedic Surgery

## 2020-11-16 ENCOUNTER — Other Ambulatory Visit: Payer: Self-pay

## 2020-11-16 ENCOUNTER — Encounter (HOSPITAL_BASED_OUTPATIENT_CLINIC_OR_DEPARTMENT_OTHER)
Admission: RE | Disposition: A | Discharge status: Home / Self Care | Payer: Self-pay | Source: Ambulatory Visit | Attending: Orthopedic Surgery

## 2020-11-16 ENCOUNTER — Ambulatory Visit (HOSPITAL_BASED_OUTPATIENT_CLINIC_OR_DEPARTMENT_OTHER)
Admission: RE | Admit: 2020-11-16 | Discharge: 2020-11-16 | Disposition: A | Discharge status: Home / Self Care | Payer: 59 | Source: Ambulatory Visit | Attending: Orthopedic Surgery | Admitting: Orthopedic Surgery

## 2020-11-16 DIAGNOSIS — S93432A Sprain of tibiofibular ligament of left ankle, initial encounter: Secondary | ICD-10-CM | POA: Insufficient documentation

## 2020-11-16 DIAGNOSIS — Z791 Long term (current) use of non-steroidal anti-inflammatories (NSAID): Secondary | ICD-10-CM | POA: Diagnosis not present

## 2020-11-16 DIAGNOSIS — Z7982 Long term (current) use of aspirin: Secondary | ICD-10-CM | POA: Insufficient documentation

## 2020-11-16 DIAGNOSIS — X58XXXA Exposure to other specified factors, initial encounter: Secondary | ICD-10-CM | POA: Insufficient documentation

## 2020-11-16 DIAGNOSIS — W19XXXA Unspecified fall, initial encounter: Secondary | ICD-10-CM | POA: Diagnosis not present

## 2020-11-16 DIAGNOSIS — S82852A Displaced trimalleolar fracture of left lower leg, initial encounter for closed fracture: Secondary | ICD-10-CM | POA: Insufficient documentation

## 2020-11-16 DIAGNOSIS — Z79899 Other long term (current) drug therapy: Secondary | ICD-10-CM | POA: Diagnosis not present

## 2020-11-16 HISTORY — PX: SYNDESMOSIS REPAIR: SHX5182

## 2020-11-16 HISTORY — DX: Sleep apnea, unspecified: G47.30

## 2020-11-16 HISTORY — PX: ORIF ANKLE FRACTURE: SHX5408

## 2020-11-16 SURGERY — OPEN REDUCTION INTERNAL FIXATION (ORIF) ANKLE FRACTURE
Anesthesia: Regional | Site: Ankle | Laterality: Left

## 2020-11-16 MED ORDER — DEXAMETHASONE SODIUM PHOSPHATE 10 MG/ML IJ SOLN
INTRAMUSCULAR | Status: DC | PRN
  Administered 2020-11-16: 5 mg via INTRAVENOUS

## 2020-11-16 MED ORDER — ONDANSETRON HCL 4 MG/2ML IJ SOLN
INTRAMUSCULAR | Status: DC | PRN
  Administered 2020-11-16: 4 mg via INTRAVENOUS

## 2020-11-16 MED ORDER — MIDAZOLAM HCL 2 MG/2ML IJ SOLN
2.0000 mg | Freq: Once | INTRAMUSCULAR | Status: AC
  Administered 2020-11-16: 2 mg via INTRAVENOUS

## 2020-11-16 MED ORDER — MIDAZOLAM HCL 2 MG/2ML IJ SOLN
INTRAMUSCULAR | Status: AC
  Filled 2020-11-16: qty 2

## 2020-11-16 MED ORDER — LIDOCAINE 2% (20 MG/ML) 5 ML SYRINGE
INTRAMUSCULAR | Status: AC
  Filled 2020-11-16: qty 5

## 2020-11-16 MED ORDER — POVIDONE-IODINE 10 % EX SWAB: 2.0000 "application " | Freq: Once | CUTANEOUS | Status: DC

## 2020-11-16 MED ORDER — FENTANYL CITRATE (PF) 100 MCG/2ML IJ SOLN
INTRAMUSCULAR | Status: DC | PRN
  Administered 2020-11-16: 50 ug via INTRAVENOUS
  Administered 2020-11-16 (×6): 25 ug via INTRAVENOUS

## 2020-11-16 MED ORDER — CEFAZOLIN SODIUM-DEXTROSE 2-3 GM-%(50ML) IV SOLR
INTRAVENOUS | Status: DC | PRN
  Administered 2020-11-16: 2 g via INTRAVENOUS

## 2020-11-16 MED ORDER — CEFAZOLIN SODIUM-DEXTROSE 2-4 GM/100ML-% IV SOLN: 2.0000 g | INTRAVENOUS | Status: DC

## 2020-11-16 MED ORDER — ACETAMINOPHEN 500 MG PO TABS
ORAL_TABLET | ORAL | Status: AC
  Filled 2020-11-16: qty 2

## 2020-11-16 MED ORDER — ONDANSETRON HCL 4 MG/2ML IJ SOLN: 4.0000 mg | Freq: Once | INTRAMUSCULAR | Status: DC | PRN

## 2020-11-16 MED ORDER — BUPIVACAINE-EPINEPHRINE (PF) 0.5% -1:200000 IJ SOLN
INTRAMUSCULAR | Status: DC | PRN
  Administered 2020-11-16: 30 mL via PERINEURAL

## 2020-11-16 MED ORDER — ACETAMINOPHEN 500 MG PO TABS
1000.0000 mg | ORAL_TABLET | Freq: Once | ORAL | Status: AC
  Administered 2020-11-16: 1000 mg via ORAL

## 2020-11-16 MED ORDER — PHENYLEPHRINE HCL (PRESSORS) 10 MG/ML IV SOLN
INTRAVENOUS | Status: DC | PRN
  Administered 2020-11-16 (×4): 120 ug via INTRAVENOUS
  Administered 2020-11-16: 80 ug via INTRAVENOUS
  Administered 2020-11-16 (×5): 120 ug via INTRAVENOUS

## 2020-11-16 MED ORDER — EPHEDRINE SULFATE 50 MG/ML IJ SOLN
INTRAMUSCULAR | Status: DC | PRN
  Administered 2020-11-16 (×2): 15 mg via INTRAVENOUS

## 2020-11-16 MED ORDER — ROCURONIUM BROMIDE 10 MG/ML (PF) SYRINGE
PREFILLED_SYRINGE | INTRAVENOUS | Status: AC
  Filled 2020-11-16: qty 10

## 2020-11-16 MED ORDER — FENTANYL CITRATE (PF) 100 MCG/2ML IJ SOLN
INTRAMUSCULAR | Status: AC
  Filled 2020-11-16: qty 2

## 2020-11-16 MED ORDER — PROPOFOL 500 MG/50ML IV EMUL
INTRAVENOUS | Status: DC | PRN
  Administered 2020-11-16: 200 ug/kg/min via INTRAVENOUS

## 2020-11-16 MED ORDER — DEXAMETHASONE SODIUM PHOSPHATE 10 MG/ML IJ SOLN
INTRAMUSCULAR | Status: AC
  Filled 2020-11-16: qty 1

## 2020-11-16 MED ORDER — LACTATED RINGERS IV SOLN: INTRAVENOUS | Status: DC

## 2020-11-16 MED ORDER — ONDANSETRON HCL 4 MG PO TABS: 4.0000 mg | ORAL_TABLET | Freq: Three times a day (TID) | ORAL | 0 refills | Status: AC | PRN

## 2020-11-16 MED ORDER — LIDOCAINE HCL (CARDIAC) PF 100 MG/5ML IV SOSY
PREFILLED_SYRINGE | INTRAVENOUS | Status: DC | PRN
  Administered 2020-11-16: 50 mg via INTRAVENOUS

## 2020-11-16 MED ORDER — PROPOFOL 10 MG/ML IV BOLUS
INTRAVENOUS | Status: DC | PRN
  Administered 2020-11-16: 150 mg via INTRAVENOUS

## 2020-11-16 MED ORDER — CEFAZOLIN SODIUM-DEXTROSE 2-4 GM/100ML-% IV SOLN
INTRAVENOUS | Status: AC
  Filled 2020-11-16: qty 100

## 2020-11-16 MED ORDER — PROPOFOL 10 MG/ML IV BOLUS
INTRAVENOUS | Status: AC
  Filled 2020-11-16: qty 20

## 2020-11-16 MED ORDER — OXYCODONE HCL 5 MG PO TABS: 5.0000 mg | ORAL_TABLET | ORAL | 0 refills | Status: AC | PRN

## 2020-11-16 MED ORDER — ROPIVACAINE HCL 7.5 MG/ML IJ SOLN
INTRAMUSCULAR | Status: DC | PRN
  Administered 2020-11-16: 20 mL via PERINEURAL

## 2020-11-16 MED ORDER — FENTANYL CITRATE (PF) 100 MCG/2ML IJ SOLN
100.0000 ug | Freq: Once | INTRAMUSCULAR | Status: AC
Start: 2020-11-16 — End: 2020-11-16
  Administered 2020-11-16: 100 ug via INTRAVENOUS

## 2020-11-16 MED ORDER — OXYCODONE HCL 5 MG PO TABS: 5.0000 mg | ORAL_TABLET | Freq: Once | ORAL | Status: DC | PRN

## 2020-11-16 MED ORDER — OXYCODONE HCL 5 MG/5ML PO SOLN: 5.0000 mg | Freq: Once | ORAL | Status: DC | PRN

## 2020-11-16 MED ORDER — HYDROMORPHONE HCL 1 MG/ML IJ SOLN: 0.2500 mg | INTRAMUSCULAR | Status: DC | PRN

## 2020-11-16 MED ORDER — ACETAMINOPHEN 10 MG/ML IV SOLN: 1000.0000 mg | Freq: Once | INTRAVENOUS | Status: DC | PRN

## 2020-11-16 MED ORDER — ONDANSETRON HCL 4 MG/2ML IJ SOLN
INTRAMUSCULAR | Status: AC
  Filled 2020-11-16: qty 2

## 2020-11-16 MED ORDER — LACTATED RINGERS IV SOLN: INTRAVENOUS | Status: DC | PRN

## 2020-11-16 MED ORDER — MIDAZOLAM HCL 2 MG/2ML IJ SOLN
INTRAMUSCULAR | Status: DC | PRN
  Administered 2020-11-16: 2 mg via INTRAVENOUS

## 2020-11-16 MED ORDER — AMISULPRIDE (ANTIEMETIC) 5 MG/2ML IV SOLN: 10.0000 mg | Freq: Once | INTRAVENOUS | Status: DC | PRN

## 2020-11-16 SURGICAL SUPPLY — 87 items
ANKLE SYNDEMOSIS ZIPTIGHT (Ankle) ×3 IMPLANT
BANDAGE ESMARK 6X9 LF (GAUZE/BANDAGES/DRESSINGS) ×2 IMPLANT
BIT DRILL 110X2.5XQCK CNCT (BIT) ×2 IMPLANT
BIT DRILL 2.5 (BIT) ×3
BIT DRILL 2.7XCANN QCK CNCT (BIT) ×2 IMPLANT
BIT DRILL CANN 2.7 (BIT) ×3
BIT DRL 110X2.5XQCK CNCT (BIT) ×2
BIT DRL 2.7XCANN QCK CNCT (BIT) ×2
BLADE SURG 15 STRL LF DISP TIS (BLADE) ×6 IMPLANT
BLADE SURG 15 STRL SS (BLADE) ×9
BNDG CMPR 9X6 STRL LF SNTH (GAUZE/BANDAGES/DRESSINGS) ×2
BNDG COHESIVE 4X5 TAN STRL (GAUZE/BANDAGES/DRESSINGS) ×3 IMPLANT
BNDG ELASTIC 4X5.8 VLCR STR LF (GAUZE/BANDAGES/DRESSINGS) ×3 IMPLANT
BNDG ELASTIC 6X5.8 VLCR STR LF (GAUZE/BANDAGES/DRESSINGS) ×3 IMPLANT
BNDG ESMARK 6X9 LF (GAUZE/BANDAGES/DRESSINGS) ×3
CANISTER SUCT 1200ML W/VALVE (MISCELLANEOUS) ×3 IMPLANT
CLSR STERI-STRIP ANTIMIC 1/2X4 (GAUZE/BANDAGES/DRESSINGS) ×6 IMPLANT
COVER BACK TABLE 60X90IN (DRAPES) ×3 IMPLANT
COVER WAND RF STERILE (DRAPES) IMPLANT
CUFF TOURN SGL QUICK 34 (TOURNIQUET CUFF) ×3
CUFF TRNQT CYL 34X4.125X (TOURNIQUET CUFF) ×2 IMPLANT
DECANTER SPIKE VIAL GLASS SM (MISCELLANEOUS) IMPLANT
DEVICE FIXATION W/ZIPLOOP (Orthopedic Implant) ×3 IMPLANT
DRAPE C-ARM 42X72 X-RAY (DRAPES) IMPLANT
DRAPE C-ARMOR (DRAPES) IMPLANT
DRAPE EXTREMITY T 121X128X90 (DISPOSABLE) ×3 IMPLANT
DRAPE IMP U-DRAPE 54X76 (DRAPES) ×3 IMPLANT
DRAPE INCISE IOBAN 66X45 STRL (DRAPES) IMPLANT
DRAPE OEC MINIVIEW 54X84 (DRAPES) ×3 IMPLANT
DRAPE U-SHAPE 47X51 STRL (DRAPES) ×3 IMPLANT
DRSG ADAPTIC 3X8 NADH LF (GAUZE/BANDAGES/DRESSINGS) IMPLANT
DRSG PAD ABDOMINAL 8X10 ST (GAUZE/BANDAGES/DRESSINGS) ×6 IMPLANT
DRSG TEGADERM 2-3/8X2-3/4 SM (GAUZE/BANDAGES/DRESSINGS) ×6 IMPLANT
DURAPREP 26ML APPLICATOR (WOUND CARE) ×3 IMPLANT
ELECT REM PT RETURN 9FT ADLT (ELECTROSURGICAL) ×3
ELECTRODE REM PT RTRN 9FT ADLT (ELECTROSURGICAL) ×2 IMPLANT
GAUZE SPONGE 4X4 12PLY STRL (GAUZE/BANDAGES/DRESSINGS) ×3 IMPLANT
GLOVE ORTHO TXT STRL SZ7.5 (GLOVE) ×3 IMPLANT
GLOVE SRG 8 PF TXTR STRL LF DI (GLOVE) ×2 IMPLANT
GLOVE SURG ENC MOIS LTX SZ7 (GLOVE) ×6 IMPLANT
GLOVE SURG UNDER POLY LF SZ7 (GLOVE) ×9 IMPLANT
GLOVE SURG UNDER POLY LF SZ8 (GLOVE) ×3
GOWN STRL REUS W/ TWL LRG LVL3 (GOWN DISPOSABLE) ×4 IMPLANT
GOWN STRL REUS W/ TWL XL LVL3 (GOWN DISPOSABLE) ×4 IMPLANT
GOWN STRL REUS W/TWL LRG LVL3 (GOWN DISPOSABLE) ×6
GOWN STRL REUS W/TWL XL LVL3 (GOWN DISPOSABLE) ×6
GUIDEWIRE PIN ORTH 6X1.6XSMTH (WIRE) ×2 IMPLANT
K-WIRE 1.6 (WIRE) ×3
NEEDLE HYPO 25X1 1.5 SAFETY (NEEDLE) IMPLANT
NS IRRIG 1000ML POUR BTL (IV SOLUTION) ×3 IMPLANT
PACK BASIN DAY SURGERY FS (CUSTOM PROCEDURE TRAY) ×3 IMPLANT
PAD CAST 4YDX4 CTTN HI CHSV (CAST SUPPLIES) ×4 IMPLANT
PADDING CAST COTTON 4X4 STRL (CAST SUPPLIES) ×6
PENCIL SMOKE EVACUATOR (MISCELLANEOUS) ×3 IMPLANT
PLATE TUBULAR 9 HOLE 1/3 (Plate) ×3 IMPLANT
SCREW CANN 1/3 THRD RVRS CT (Screw) ×2 IMPLANT
SCREW CANNULATED 4.0X40 (Screw) ×3 IMPLANT
SCREW CORTICAL 3.5 16MM (Screw) ×3 IMPLANT
SCREW CORTICAL 3.5X12 (Screw) ×6 IMPLANT
SCREW CORTICAL 3.5X14 (Screw) ×9 IMPLANT
SHEET MEDIUM DRAPE 40X70 STRL (DRAPES) ×3 IMPLANT
SLEEVE SCD COMPRESS KNEE MED (STOCKING) ×3 IMPLANT
SPLINT FAST PLASTER 5X30 (CAST SUPPLIES)
SPLINT PLASTER CAST FAST 5X30 (CAST SUPPLIES) IMPLANT
SPONGE LAP 4X18 RFD (DISPOSABLE) ×3 IMPLANT
STAPLER VISISTAT 35W (STAPLE) IMPLANT
SUCTION FRAZIER HANDLE 10FR (MISCELLANEOUS) ×3
SUCTION TUBE FRAZIER 10FR DISP (MISCELLANEOUS) ×2 IMPLANT
SUT ETHILON 3 0 PS 1 (SUTURE) IMPLANT
SUT ETHILON 4 0 PS 2 18 (SUTURE) IMPLANT
SUT MNCRL AB 4-0 PS2 18 (SUTURE) IMPLANT
SUT VIC AB 0 CT1 27 (SUTURE)
SUT VIC AB 0 CT1 27XBRD ANBCTR (SUTURE) IMPLANT
SUT VIC AB 2-0 SH 18 (SUTURE) IMPLANT
SUT VIC AB 2-0 SH 27 (SUTURE) ×3
SUT VIC AB 2-0 SH 27XBRD (SUTURE) ×2 IMPLANT
SUT VIC AB 3-0 SH 27 (SUTURE)
SUT VIC AB 3-0 SH 27X BRD (SUTURE) IMPLANT
SUT VICRYL 3-0 CR8 SH (SUTURE) ×3 IMPLANT
SYR BULB EAR ULCER 3OZ GRN STR (SYRINGE) ×3 IMPLANT
SYR CONTROL 10ML LL (SYRINGE) IMPLANT
SYSTEM FIXATN ANKL SYNDESMOSIS (Ankle) ×2 IMPLANT
TOWEL GREEN STERILE FF (TOWEL DISPOSABLE) ×6 IMPLANT
TUBE CONNECTING 20X1/4 (TUBING) ×3 IMPLANT
UNDERPAD 30X36 HEAVY ABSORB (UNDERPADS AND DIAPERS) ×3 IMPLANT
WASHER SM (Washer) ×3 IMPLANT
YANKAUER SUCT BULB TIP NO VENT (SUCTIONS) ×3 IMPLANT

## 2020-11-16 NOTE — Anesthesia Postprocedure Evaluation (Signed)
Anesthesia Post Note  Patient: Alexa Li  Procedure(s) Performed: OPEN REDUCTION INTERNAL FIXATION (ORIF) ANKLE FRACTURE,TRIMALLEOLAR AND ORIF SYNDESMOSIS (Left Ankle) SYNDESMOSIS REPAIR (Ankle)     Patient location during evaluation: PACU Anesthesia Type: Regional Level of consciousness: awake and alert Pain management: pain level controlled Vital Signs Assessment: post-procedure vital signs reviewed and stable Respiratory status: spontaneous breathing, nonlabored ventilation, respiratory function stable and patient connected to nasal cannula oxygen Cardiovascular status: stable and blood pressure returned to baseline Postop Assessment: no apparent nausea or vomiting Anesthetic complications: no   No complications documented.  Last Vitals:  Vitals:   11/16/20 1403 11/16/20 1415  BP: 118/76 116/71  Pulse: 69 70  Resp: 12 14  Temp: 36.6 C   SpO2: 97% 98%    Last Pain:  Vitals:   11/16/20 1403  TempSrc:   PainSc: Asleep                 Trevor Iha

## 2020-11-16 NOTE — Anesthesia Procedure Notes (Signed)
Anesthesia Regional Block: Adductor canal block   Pre-Anesthetic Checklist: ,, timeout performed, Correct Patient, Correct Site, Correct Laterality, Correct Procedure, Correct Position, site marked, Risks and benefits discussed,  Surgical consent,  Pre-op evaluation,  At surgeon's request and post-op pain management  Laterality: Lower and Left  Prep: chloraprep       Needles:  Injection technique: Single-shot  Needle Type: Echogenic Needle     Needle Length: 9cm  Needle Gauge: 22     Additional Needles:   Procedures:,,,, ultrasound used (permanent image in chart),,,,  Narrative:  Start time: 11/16/2020 10:24 AM End time: 11/16/2020 10:29 AM Injection made incrementally with aspirations every 5 mL.  Performed by: Personally  Anesthesiologist: Trevor Iha, MD  Additional Notes: Block assessed prior to surgery. Pt tolerated procedure well.

## 2020-11-16 NOTE — Transfer of Care (Signed)
Immediate Anesthesia Transfer of Care Note  Patient: Alexa Li  Procedure(s) Performed: OPEN REDUCTION INTERNAL FIXATION (ORIF) ANKLE FRACTURE,TRIMALLEOLAR AND ORIF SYNDESMOSIS (Left Ankle) SYNDESMOSIS REPAIR (Ankle)  Patient Location: PACU  Anesthesia Type:General and Regional  Level of Consciousness: awake, alert  and oriented  Airway & Oxygen Therapy: Patient Spontanous Breathing and Patient connected to face mask oxygen  Post-op Assessment: Report given to RN and Post -op Vital signs reviewed and stable  Post vital signs: Reviewed and stable  Last Vitals:  Vitals Value Taken Time  BP 118/76 11/16/20 1402  Temp    Pulse 70 11/16/20 1404  Resp 13 11/16/20 1404  SpO2 98 % 11/16/20 1404  Vitals shown include unvalidated device data.  Last Pain:  Vitals:   11/16/20 0947  TempSrc: Oral  PainSc: 0-No pain      Patients Stated Pain Goal: 3 (11/16/20 0947)  Complications: No complications documented.

## 2020-11-16 NOTE — Progress Notes (Signed)
AssistedDr. Houser with left, ultrasound guided, adductor canal block. Side rails up, monitors on throughout procedure. See vital signs in flow sheet. Tolerated Procedure well.  

## 2020-11-16 NOTE — Interval H&P Note (Signed)
History and Physical Interval Note:  11/16/2020 11:09 AM  Alexa Li  has presented today for surgery, with the diagnosis of LEFT ANKLE FRACTURE.  The various methods of treatment have been discussed with the patient and family. After consideration of risks, benefits and other options for treatment, the patient has consented to  Procedure(s): OPEN REDUCTION INTERNAL FIXATION (ORIF) ANKLE FRACTURE,TRIMALLEOLAR AND ORIF SYNDESMOSIS (Left) as a surgical intervention.  The patient's history has been reviewed, patient examined, no change in status, stable for surgery.  I have reviewed the patient's chart and labs.  Questions were answered to the patient's satisfaction.     Eulas Post

## 2020-11-16 NOTE — Progress Notes (Signed)
Assisted Dr. Houser with left, ultrasound guided, popliteal block. Side rails up, monitors on throughout procedure. See vital signs in flow sheet. Tolerated Procedure well. 

## 2020-11-16 NOTE — Op Note (Signed)
11/16/2020  PATIENT:  Alexa Li    PRE-OPERATIVE DIAGNOSIS:  Left trimalleolar ankle fracture with syndesmotic disruption  POST-OPERATIVE DIAGNOSIS:  Same  PROCEDURE:   1.  Open reduction internal fixation trimalleolar ankle fracture without fixation of the posterior lip 2.  Open reduction internal fixation left syndesmosis 3.  3 views of the left ankle taken postoperatively demonstrating anatomic reconstruction with internal fixation.  SURGEON:  Eulas Post, MD  PHYSICIAN ASSISTANT: Janine Ores, PA-C, present and scrubbed throughout the case, critical for completion in a timely fashion, and for retraction, instrumentation, and closure.  ANESTHESIA:   General with regional block  ESTIMATED BLOOD LOSS: 65ml  PREOPERATIVE INDICATIONS:  Alexa Li is a  60 y.o. female with a diagnosis of LEFT ANKLE FRACTURE who elected for surgical management to minimize the risk for malunion and nonunion and post-traumatic arthritis.    The risks benefits and alternatives were discussed with the patient preoperatively including but not limited to the risks of infection, bleeding, nerve injury, cardiopulmonary complications, the need for revision surgery, the need for hardware removal, among others, and the patient was willing to proceed.  OPERATIVE IMPLANTS: 1/3 tubular plate, with 3 proximal cortical screws, and 3 distal screws, with one 4.0 mm cannulated screw with a washer for the medial malleolus.  I used a total of 2 stainless steel zip tight through the distal portion of the plate for syndesmotic fixation.  OPERATIVE PROCEDURE: The patient was brought to the operating room and placed in the supine position. All bony prominences were padded. General anesthesia was administered. The lower extremity was prepped and draped in the usual sterile fashion. The leg was elevated and exsanguinated and the tourniquet was not utilized. Time out was performed.   Incision was made over the distal  fibula and the fracture was exposed and reduced anatomically with a clamp.  The fracture was fairly transverse, with some posterior comminution, not amenable to lag screw fixation.  I then applied a 1/3 tubular locking plate and secured it proximally and distally with non-locking screws. Bone quality was fair. I used c-arm to confirm satisfactory reduction and fixation.   I then turned my attention to the medial malleolus. Incision was made over the medial malleolus and the fracture exposed and held provisionally with a clamp. A guidepin was placed for the 4.0 mm cannulated screws and then confirmation of reduction was made with fluoroscopy. I then placed a 4mm screw with a washer which had satisfactory fixation.   I reduced the syndesmosis manually, and then placed a guidewire across the fibula and tibia, I was a little bit anterior to begin with, my ultimate pass was slightly posterior but still appropriate.  I drilled through the cortices, passed the button and flipped it and then had excellent fixation.  I performed the same procedure slightly more anterior through the second hole from the bottom of the plate.  This provided to appropriate fixation devices for the syndesmosis.  I examined the ankle under live fluoroscopy and it was found to be stable, with a congruent arc of the talus.  The wounds were irrigated, and closed with vicryl with routine closure for the skin.  She did have a slight abrasion anteriorly which I placed a Tegaderm over both during the operation, as well as replacing with a fresh Tegaderm during the splinting portion of the procedure.    She had a preoperative nerve block.  Sterile gauze was applied followed by a posterior splint. She was awakened  and returned to the PACU in stable and satisfactory condition. There were no complications.

## 2020-11-16 NOTE — H&P (Signed)
PREOPERATIVE H&P  Chief Complaint: left ankle pain  HPI: Alexa Li is a 60 y.o. female with history of anxiety, depression, hyperlipidemia, hypertension, sleep apnea previous left fracture who presents for preoperative history and physical with a diagnosis of left ankle fracture.  She states her she was taking her father to physical therapy when she fell down onto her hands and knees. She had immediate severe left ankle pain, worse with weight bearing and movement and better with rest.  EMS was called and she was brought to the emergency department. On arrival to the ED x-rays of the left ankle were taken showing a left ankle fracture dislocation.  Reduction under conscious sedation was performed by ER providers and ankle was splinted. She was discharged and returns today for definitive surgical management.   Past Medical History:  Diagnosis Date  . Anxiety    severe 12/13  . Arthritis   . Closed displaced fracture of greater tuberosity of left humerus 01/30/2013  . Depression   . Herpes simplex   . Hyperlipidemia   . Hypertension   . Sleep apnea    mild cpap on backorder  . Wears glasses    Past Surgical History:  Procedure Laterality Date  . CESAREAN SECTION    . ORIF HUMERUS FRACTURE Left 01/30/2013   Procedure: OPEN REDUCTION INTERNAL FIXATION (ORIF) PROXIMAL HUMERUS FRACTURE;  Surgeon: Eulas Post, MD;  Location: [facility];  Service: Orthopedics;  Laterality: Left;  . ORIF SHOULDER FRACTURE Left 02/14/2013   Procedure: OPEN REDUCTION INTERNAL FIXATION (ORIF) SHOULDER FRACTURE;  Surgeon: Eulas Post, MD;  Location: [facility];  Service: Orthopedics;  Laterality: Left;  . SHOULDER ARTHROSCOPY  2006   left  . TUBAL LIGATION  2004  . WISDOM TOOTH EXTRACTION     Social History   Socioeconomic History  . Marital status: Married    Spouse name: Not on file  . Number of children: Not on file  . Years of education: Not on file  . Highest  education level: Not on file  Occupational History  . Not on file  Tobacco Use  . Smoking status: Never Smoker  . Smokeless tobacco: Never Used  Substance and Sexual Activity  . Alcohol use: No    Comment: occ  . Drug use: No  . Sexual activity: Yes    Partners: Male    Birth control/protection: Surgical    Comment: BTL  Other Topics Concern  . Not on file  Social History Narrative  . Not on file   Social Determinants of Health   Financial Resource Strain: Not on file  Food Insecurity: Not on file  Transportation Needs: Not on file  Physical Activity: Not on file  Stress: Not on file  Social Connections: Not on file   Family History  Problem Relation Age of Onset  . Hypertension Other        grandparents  . Dementia Mother   . Heart attack Mother   . Thyroid disease Mother   . Heart Problems Maternal Grandmother   . Emphysema Paternal Grandfather   . Hypothyroidism Father    No Known Allergies Prior to Admission medications   Medication Sig Start Date End Date Taking? Authorizing Provider  acyclovir (ZOVIRAX) 400 MG tablet Take 400 mg by mouth daily.   Yes [provider]  aspirin EC 81 MG tablet Take 81 mg by mouth daily.   Yes [provider]  buPROPion (WELLBUTRIN XL) 300 MG 24  hr tablet Take 300 mg by mouth daily.   Yes [provider]  Calcium Carb-Cholecalciferol (CALCIUM + D3) 600-200 MG-UNIT TABS Take 2 tablets by mouth daily.   Yes [provider]  celecoxib (CELEBREX) 200 MG capsule Take 200 mg by mouth daily.   Yes [provider]  cholecalciferol (VITAMIN D) 1000 UNITS tablet Take 1,000 Units by mouth daily.   Yes [provider]  CycloSPORINE (RESTASIS OP) Apply to eye. One drop each eye BID   Yes [provider]  docusate sodium (COLACE) 100 MG capsule Take 100 mg by mouth 2 (two) times daily.   Yes [provider]  fish oil-omega-3 fatty acids 1000 MG capsule Take 1 g by mouth  daily.   Yes [provider]  hydrochlorothiazide (MICROZIDE) 12.5 MG capsule Take 12.5 mg by mouth daily.   Yes [provider]  hydrocortisone (ANUSOL-HC) 2.5 % rectal cream Place rectally 2 (two) times daily. 06/09/13  Yes Jerene Bears, MD  lidocaine (LIDODERM) 5 % Place 1 patch onto the skin daily. Remove & Discard patch within 12 hours or as directed by MD 05/31/20  Yes Couture, Cortni S, PA-C  LORazepam (ATIVAN) 1 MG tablet Take 1 mg by mouth. TID PRN   Yes [provider]  Lysine 500 MG TABS Take 1 tablet by mouth daily.   Yes [provider]  metoprolol succinate (TOPROL-XL) 100 MG 24 hr tablet Take 50 mg by mouth 2 (two) times daily. Take with or immediately following a meal.   Yes [provider]  Multiple Vitamin (MULTIVITAMIN WITH MINERALS) TABS Take 1 tablet by mouth daily.   Yes [provider]  Polyethyl Glycol-Propyl Glycol (SYSTANE OP) Apply to eye. One drop each eye QID PRN   Yes [provider]  Polyethylene Glycol 3350 (MIRALAX PO) Take by mouth as needed.   Yes [provider]  QUEtiapine (SEROQUEL XR) 50 MG TB24 Take 50 mg by mouth at bedtime.   Yes [provider]  simvastatin (ZOCOR) 5 MG tablet Take 5 mg by mouth at bedtime.   Yes [provider]  venlafaxine (EFFEXOR) 75 MG tablet Take 150 mg by mouth.   Yes [provider]  vitamin C (ASCORBIC ACID) 500 MG tablet Take 500 mg by mouth daily.   Yes [provider]  ondansetron (ZOFRAN ODT) 4 MG disintegrating tablet Take 1 tablet (4 mg total) by mouth every 8 (eight) hours as needed for nausea or vomiting. 11/12/20   Maxwell Caul, PA-C  oxyCODONE-acetaminophen (PERCOCET/ROXICET) 5-325 MG tablet Take 1-2 tablets by mouth every 4 (four) hours as needed for severe pain. 11/12/20   Maxwell Caul, PA-C     Positive ROS: All other systems have been reviewed and were otherwise negative with the exception of those  mentioned in the HPI and as above.  Physical Exam: General: Alert, no acute distress Cardiovascular: No pedal edema Respiratory: No cyanosis, no use of accessory musculature GI: No organomegaly, abdomen is soft and non-tender Skin: Patient splinted. Per report from ED providers on 2/25 , no pressure wounds. She had a small superficial abrasian but this was explored and no signs of open fracture. Neurologic: Sensation intact distally Psychiatric: Patient is competent for consent with normal mood and affect Lymphatic: No axillary or cervical lymphadenopathy  MUSCULOSKELETAL: Splint intact. Able to wiggle all toes of left foot. Good capillary refill. Calf compressible. Sensation intact to all toes.   Assessment: Left trimalleolar ankle fracture  Plan: Plan for Procedure(s): OPEN REDUCTION INTERNAL FIXATION (ORIF) ANKLE FRACTURE,TRIMALLEOLAR AND ORIF SYNDESMOSIS  The risks benefits and alternatives were discussed with the patient including but not limited to the risks of nonoperative treatment, versus surgical intervention including infection, bleeding, nerve injury,  blood clots, cardiopulmonary complications, morbidity, mortality, among others, and they were willing to proceed.    Patient's anticipated LOS is less than 2 midnights, meeting these requirements: - Younger than 22 - Lives within 1 hour of care - Has a competent adult at home to recover with post-op recover - NO history of  - Chronic pain requiring opiods  - Diabetes  - Coronary Artery Disease  - Heart failure  - Heart attack  - Stroke  - DVT/VTE  - Cardiac arrhythmia  - Respiratory Failure/COPD  - Renal failure  - Anemia  - Advanced Liver disease   Armida Sans, PA-C    11/16/2020 6:54 AM

## 2020-11-16 NOTE — Discharge Instructions (Signed)
Diet: As you were doing prior to hospitalization   Shower:  May shower but keep the wounds dry, use an occlusive plastic wrap, NO SOAKING IN TUB.  If the bandage gets wet, change with a clean dry gauze.  If you have a splint on, leave the splint in place and keep the splint dry with a plastic bag.  Dressing:  You may change your dressing 3-5 days after surgery, unless you have a splint.  If you have a splint, then just leave the splint in place and we will change your bandages during your first follow-up appointment.    If you had hand or foot surgery, we will plan to remove your stitches in about 2 weeks in the office.  For all other surgeries, there are sticky tapes (steri-strips) on your wounds and all the stitches are absorbable.  Leave the steri-strips in place when changing your dressings, they will peel off with time, usually 2-3 weeks.  Activity:  Increase activity slowly as tolerated, but follow the weight bearing instructions below.  The rules on driving is that you can not be taking narcotics while you drive, and you must feel in control of the vehicle.    Weight Bearing:   Do not bear weight on left foot.  To prevent constipation: you may use a stool softener such as -  Colace (over the counter) 100 mg by mouth twice a day  Drink plenty of fluids (prune juice may be helpful) and high fiber foods Miralax (over the counter) for constipation as needed.    Itching:  If you experience itching with your medications, try taking only a single pain pill, or even half a pain pill at a time.  You may take up to 10 pain pills per day, and you can also use benadryl over the counter for itching or also to help with sleep.   Precautions:  If you experience chest pain or shortness of breath - call 911 immediately for transfer to the hospital emergency department!!  If you develop a fever greater that 101 F, purulent drainage from wound, increased redness or drainage from wound, or calf pain -- Call  the office at [phone]                                                Follow- Up Appointment:  Please call for an appointment to be seen in 2 weeks [place] - [phone]  Medication: You have note been sent in new medication since per our hospital record you still have oxycodone 5/325 that you were given upon discharge at the hospital. Please be aware that the Ativan you take at home can have dangerous side effects when taken with narcotic pain medication. Please do not use this medication when taking your pain medication.     No Tylenol before 6:00pm   Post Anesthesia Home Care Instructions  Activity: Get plenty of rest for the remainder of the day. A responsible individual must stay with you for 24 hours following the procedure.  For the next 24 hours, DO NOT: -Drive a car -Advertising copywriter -Drink alcoholic beverages -Take any medication unless instructed by your physician -Make any legal decisions or sign important papers.  Meals: Start with liquid foods such as gelatin or soup. Progress to regular foods as tolerated. Avoid greasy, spicy, heavy foods. If nausea and/or vomiting occur,  drink only clear liquids until the nausea and/or vomiting subsides. Call your physician if vomiting continues.  Special Instructions/Symptoms: Your throat may feel dry or sore from the anesthesia or the breathing tube placed in your throat during surgery. If this causes discomfort, gargle with warm salt water. The discomfort should disappear within 24 hours.  If you had a scopolamine patch placed behind your ear for the management of post- operative nausea and/or vomiting:  1. The medication in the patch is effective for 72 hours, after which it should be removed.  Wrap patch in a tissue and discard in the trash. Wash hands thoroughly with soap and water. 2. You may remove the patch earlier than 72 hours if you experience unpleasant side effects which may include dry mouth, dizziness or visual  disturbances. 3. Avoid touching the patch. Wash your hands with soap and water after contact with the patch.     Regional Anesthesia Blocks  1. Numbness or the inability to move the "blocked" extremity may last from 3-48 hours after placement. The length of time depends on the medication injected and your individual response to the medication. If the numbness is not going away after 48 hours, call your surgeon.  2. The extremity that is blocked will need to be protected until the numbness is gone and the  Strength has returned. Because you cannot feel it, you will need to take extra care to avoid injury. Because it may be weak, you may have difficulty moving it or using it. You may not know what position it is in without looking at it while the block is in effect.  3. For blocks in the legs and feet, returning to weight bearing and walking needs to be done carefully. You will need to wait until the numbness is entirely gone and the strength has returned. You should be able to move your leg and foot normally before you try and bear weight or walk. You will need someone to be with you when you first try to ensure you do not fall and possibly risk injury.  4. Bruising and tenderness at the needle site are common side effects and will resolve in a few days.  5. Persistent numbness or new problems with movement should be communicated to the surgeon or the [facility] [phone] [facility] [phone]).

## 2020-11-16 NOTE — Anesthesia Procedure Notes (Signed)
Procedure Name: LMA Insertion Performed by: Tenesia Escudero M, CRNA Pre-anesthesia Checklist: Patient identified, Emergency Drugs available, Suction available and Patient being monitored Patient Re-evaluated:Patient Re-evaluated prior to induction Oxygen Delivery Method: Circle system utilized Preoxygenation: Pre-oxygenation with 100% oxygen Induction Type: IV induction Ventilation: Mask ventilation without difficulty LMA: LMA inserted LMA Size: 4.0 Number of attempts: 1 Airway Equipment and Method: Bite block Placement Confirmation: positive ETCO2,  CO2 detector and breath sounds checked- equal and bilateral Tube secured with: Tape Dental Injury: Teeth and Oropharynx as per pre-operative assessment        

## 2020-11-16 NOTE — Anesthesia Procedure Notes (Signed)
Anesthesia Regional Block: Popliteal block   Pre-Anesthetic Checklist: ,, timeout performed, Correct Patient, Correct Site, Correct Laterality, Correct Procedure, Correct Position, site marked, Risks and benefits discussed, pre-op evaluation,  At surgeon's request and post-op pain management  Laterality: Lower and Left  Prep: Maximum Sterile Barrier Precautions used, chloraprep       Needles:  Injection technique: Single-shot  Needle Type: Echogenic Needle     Needle Length: 9cm  Needle Gauge: 21     Additional Needles:   Procedures:,,,, ultrasound used (permanent image in chart),,,,  Narrative:  Start time: 11/16/2020 10:15 AM End time: 11/16/2020 10:23 AM Injection made incrementally with aspirations every 5 mL.  Performed by: Personally  Anesthesiologist: Trevor Iha, MD  Additional Notes: Block assessed. Patient tolerated procedure well.

## 2020-11-17 ENCOUNTER — Encounter (HOSPITAL_BASED_OUTPATIENT_CLINIC_OR_DEPARTMENT_OTHER): Payer: Self-pay | Admitting: Orthopedic Surgery

## 2021-07-26 ENCOUNTER — Other Ambulatory Visit: Payer: Self-pay | Admitting: Family Medicine

## 2021-07-26 DIAGNOSIS — Z1231 Encounter for screening mammogram for malignant neoplasm of breast: Secondary | ICD-10-CM

## 2021-08-25 ENCOUNTER — Other Ambulatory Visit: Payer: Self-pay

## 2021-08-25 ENCOUNTER — Ambulatory Visit
Admission: RE | Admit: 2021-08-25 | Discharge: 2021-08-25 | Disposition: A | Discharge status: Home / Self Care | Payer: 59 | Source: Ambulatory Visit | Attending: Family Medicine | Admitting: Family Medicine

## 2021-08-25 DIAGNOSIS — Z1231 Encounter for screening mammogram for malignant neoplasm of breast: Secondary | ICD-10-CM

## 2022-10-23 IMAGING — MG MM DIGITAL SCREENING BILAT W/ TOMO AND CAD
8 series · 8 of 24 positions shown · non-contrast
Comparison: Previous exam(s).

CLINICAL DATA: Screening.

EXAM:
DIGITAL SCREENING BILATERAL MAMMOGRAM WITH TOMOSYNTHESIS AND CAD
TECHNIQUE: Bilateral screening digital craniocaudal and mediolateral oblique
mammograms were obtained. Bilateral screening digital breast
tomosynthesis was performed. The images were evaluated with
computer-aided detection.

[R CC synth-2D]
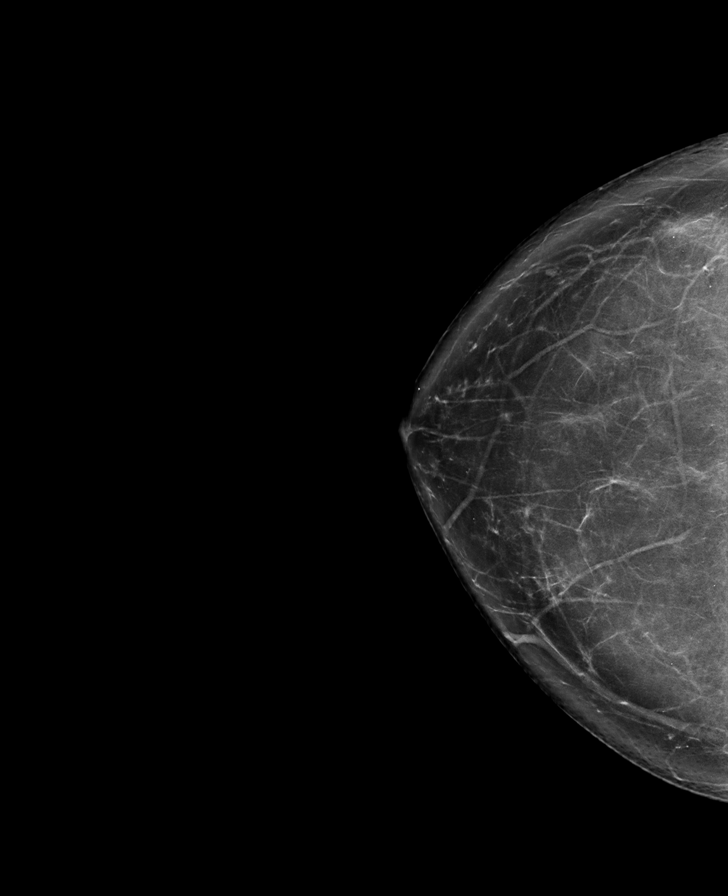

[R MLO synth-2D]
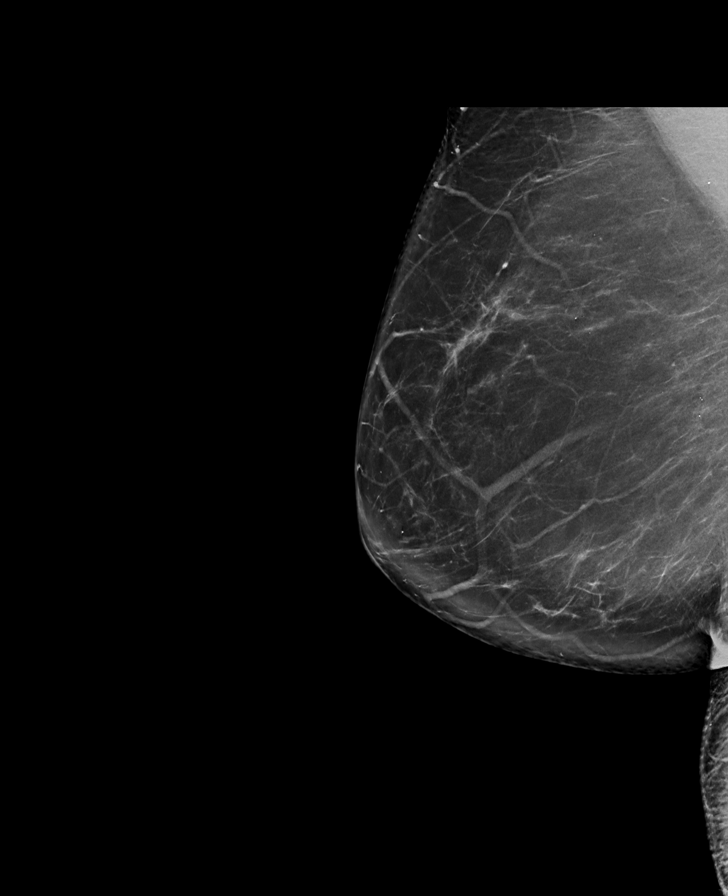

[L CC synth-2D]
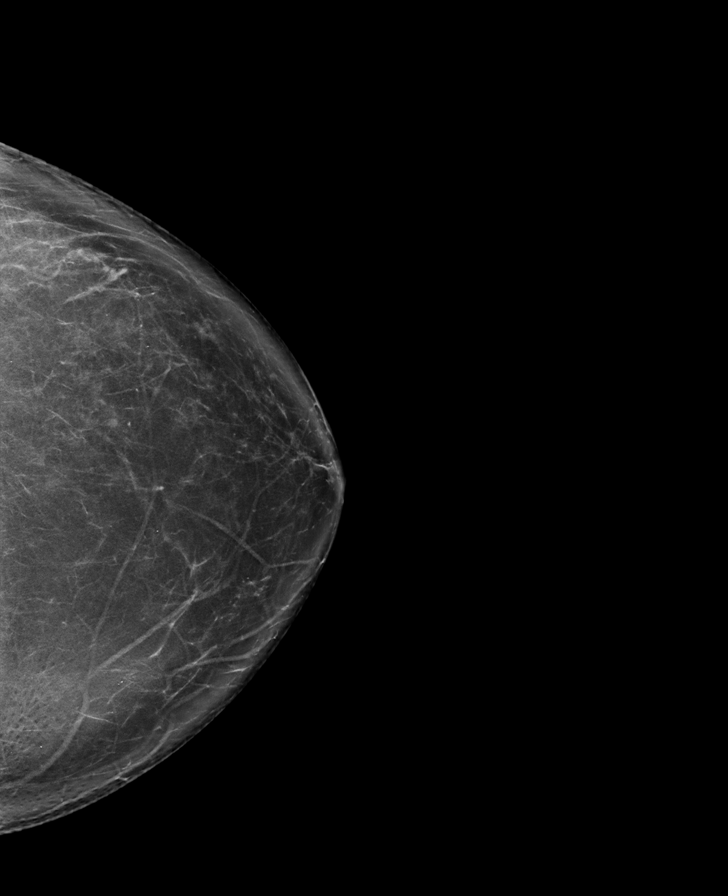

[L MLO synth-2D]
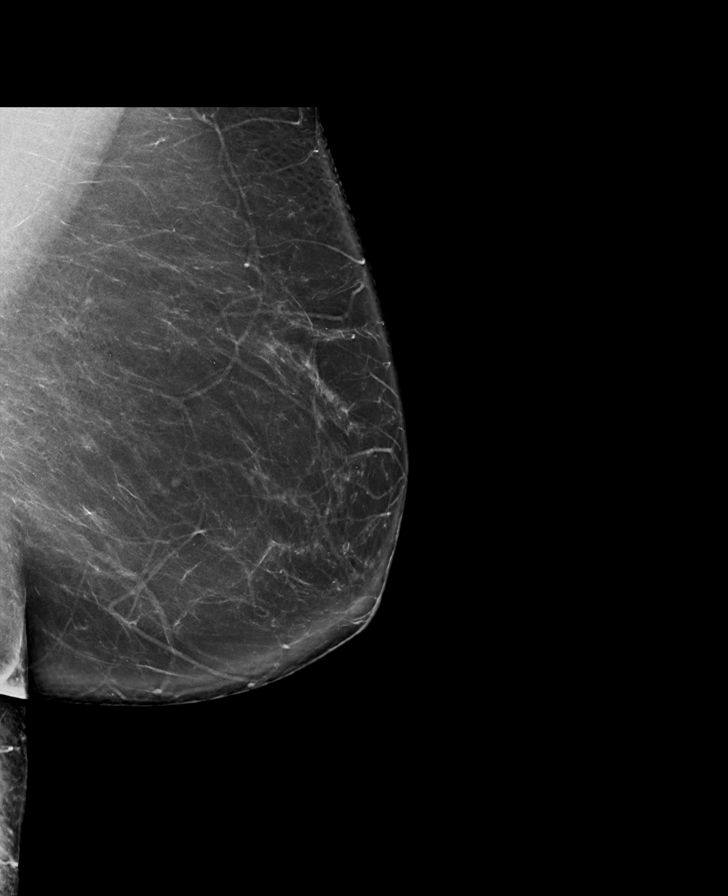

[L CC tomo · tomo slice 43/86.0]
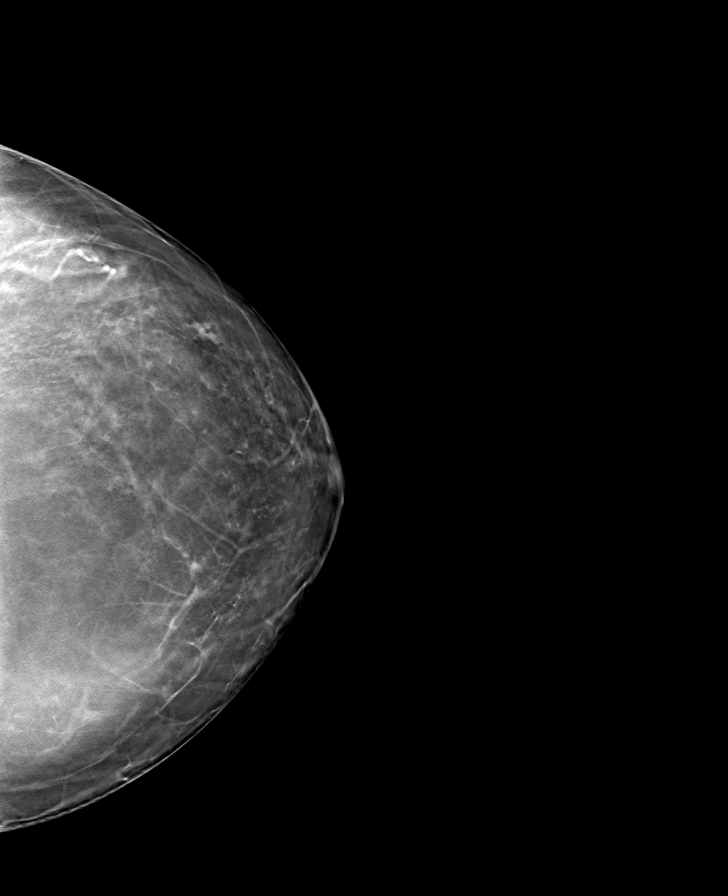

[R CC tomo · tomo slice 43/86.0]
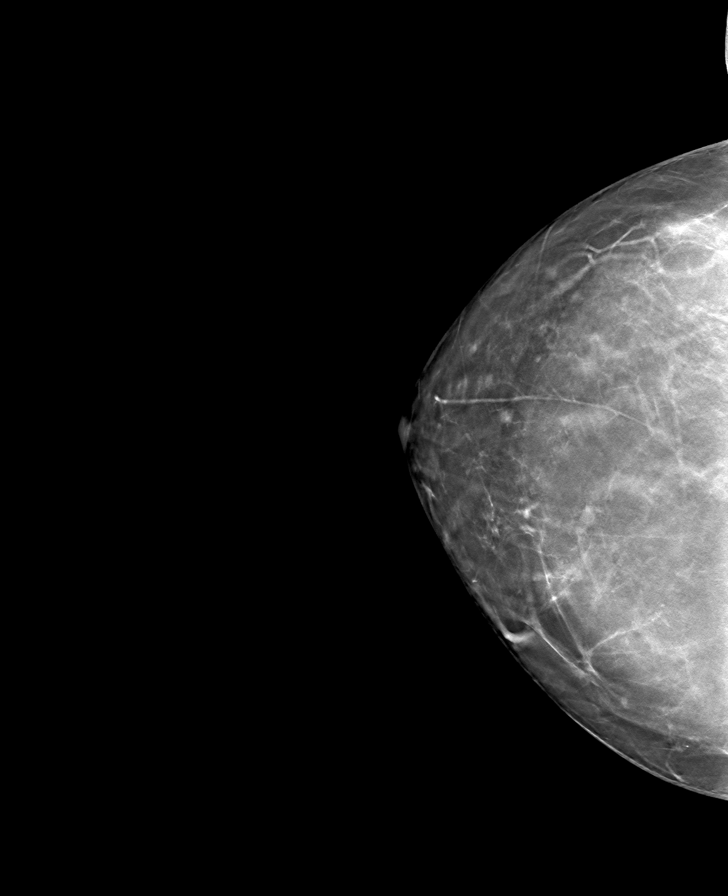

[R MLO tomo · tomo slice 48/95.0]
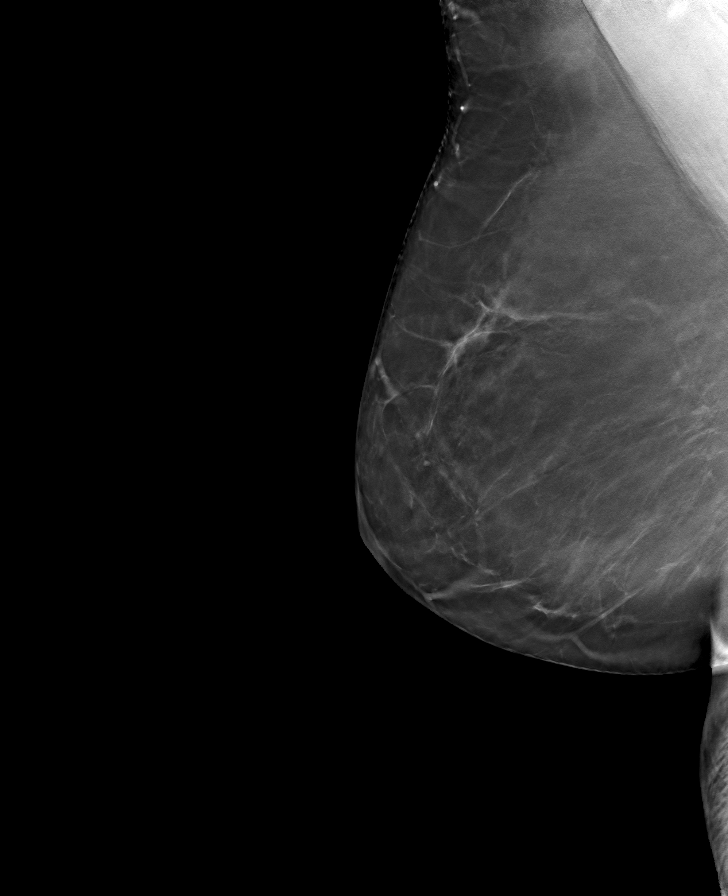

[L MLO tomo · tomo slice 49/98.0]
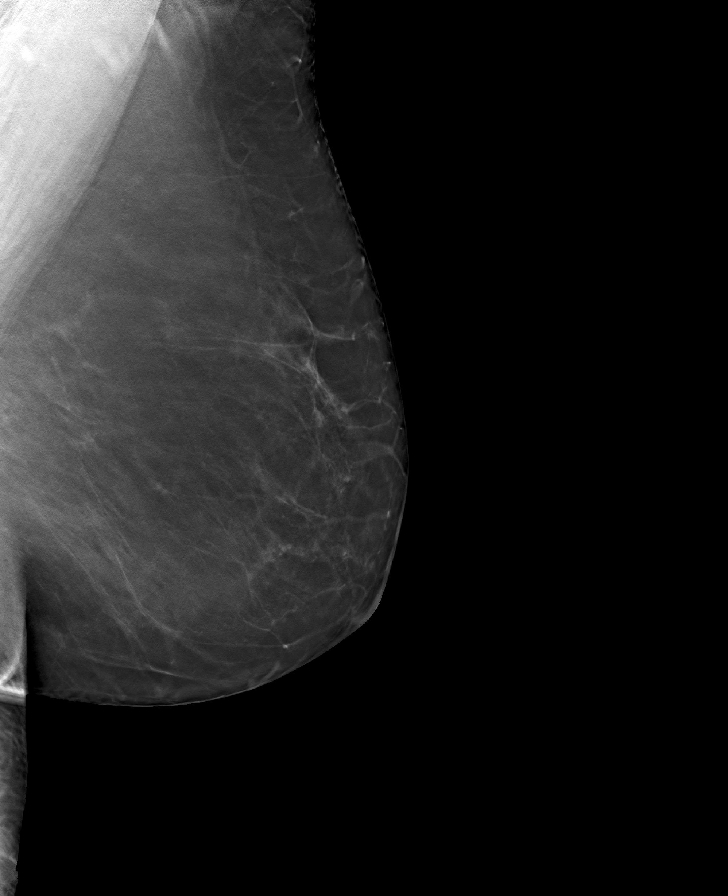

[8 of 24 positions shown; findings below may reference images not displayed]

ACR Breast Density Category b: There are scattered areas of
fibroglandular density.
FINDINGS: There are no findings suspicious for malignancy.
IMPRESSION: No mammographic evidence of malignancy. A result letter of this
screening mammogram will be mailed directly to the patient.

RECOMMENDATION:
Screening mammogram in one year. (Code:51-O-LD2)

BI-RADS CATEGORY  1: Negative.

## 2022-11-08 DIAGNOSIS — G4733 Obstructive sleep apnea (adult) (pediatric): Secondary | ICD-10-CM | POA: Diagnosis not present

## 2022-11-08 DIAGNOSIS — Z6838 Body mass index (BMI) 38.0-38.9, adult: Secondary | ICD-10-CM | POA: Diagnosis not present

## 2022-12-04 DIAGNOSIS — G4733 Obstructive sleep apnea (adult) (pediatric): Secondary | ICD-10-CM | POA: Diagnosis not present

## 2023-01-04 DIAGNOSIS — G4733 Obstructive sleep apnea (adult) (pediatric): Secondary | ICD-10-CM | POA: Diagnosis not present

## 2023-02-03 DIAGNOSIS — G4733 Obstructive sleep apnea (adult) (pediatric): Secondary | ICD-10-CM | POA: Diagnosis not present

## 2023-02-15 DIAGNOSIS — E669 Obesity, unspecified: Secondary | ICD-10-CM | POA: Diagnosis not present

## 2023-02-15 DIAGNOSIS — G4733 Obstructive sleep apnea (adult) (pediatric): Secondary | ICD-10-CM | POA: Diagnosis not present

## 2023-03-04 DIAGNOSIS — G4733 Obstructive sleep apnea (adult) (pediatric): Secondary | ICD-10-CM | POA: Diagnosis not present

## 2023-03-21 ENCOUNTER — Other Ambulatory Visit: Payer: Self-pay | Admitting: Family Medicine

## 2023-03-21 DIAGNOSIS — Z1231 Encounter for screening mammogram for malignant neoplasm of breast: Secondary | ICD-10-CM

## 2023-04-03 DIAGNOSIS — G4733 Obstructive sleep apnea (adult) (pediatric): Secondary | ICD-10-CM | POA: Diagnosis not present

## 2023-04-19 ENCOUNTER — Ambulatory Visit
Admission: RE | Admit: 2023-04-19 | Discharge: 2023-04-19 | Disposition: A | Discharge status: Home / Self Care | Payer: 59 | Source: Ambulatory Visit | Attending: Family Medicine | Admitting: Family Medicine

## 2023-04-19 DIAGNOSIS — Z1231 Encounter for screening mammogram for malignant neoplasm of breast: Secondary | ICD-10-CM | POA: Diagnosis not present

## 2023-05-04 DIAGNOSIS — G4733 Obstructive sleep apnea (adult) (pediatric): Secondary | ICD-10-CM | POA: Diagnosis not present

## 2023-08-09 DIAGNOSIS — F331 Major depressive disorder, recurrent, moderate: Secondary | ICD-10-CM | POA: Diagnosis not present

## 2023-08-22 DIAGNOSIS — F331 Major depressive disorder, recurrent, moderate: Secondary | ICD-10-CM | POA: Diagnosis not present

## 2023-08-29 DIAGNOSIS — G4733 Obstructive sleep apnea (adult) (pediatric): Secondary | ICD-10-CM | POA: Diagnosis not present

## 2023-08-30 DIAGNOSIS — F331 Major depressive disorder, recurrent, moderate: Secondary | ICD-10-CM | POA: Diagnosis not present

## 2023-09-05 DIAGNOSIS — F331 Major depressive disorder, recurrent, moderate: Secondary | ICD-10-CM | POA: Diagnosis not present

## 2023-09-27 DIAGNOSIS — F331 Major depressive disorder, recurrent, moderate: Secondary | ICD-10-CM | POA: Diagnosis not present

## 2023-10-17 DIAGNOSIS — F331 Major depressive disorder, recurrent, moderate: Secondary | ICD-10-CM | POA: Diagnosis not present

## 2023-11-14 DIAGNOSIS — F331 Major depressive disorder, recurrent, moderate: Secondary | ICD-10-CM | POA: Diagnosis not present

## 2023-11-16 DIAGNOSIS — F331 Major depressive disorder, recurrent, moderate: Secondary | ICD-10-CM | POA: Diagnosis not present

## 2023-11-16 DIAGNOSIS — R7303 Prediabetes: Secondary | ICD-10-CM | POA: Diagnosis not present

## 2023-11-28 DIAGNOSIS — F331 Major depressive disorder, recurrent, moderate: Secondary | ICD-10-CM | POA: Diagnosis not present

## 2023-12-12 DIAGNOSIS — F331 Major depressive disorder, recurrent, moderate: Secondary | ICD-10-CM | POA: Diagnosis not present

## 2024-01-10 DIAGNOSIS — F331 Major depressive disorder, recurrent, moderate: Secondary | ICD-10-CM | POA: Diagnosis not present

## 2024-01-24 DIAGNOSIS — F331 Major depressive disorder, recurrent, moderate: Secondary | ICD-10-CM | POA: Diagnosis not present

## 2024-02-07 DIAGNOSIS — F331 Major depressive disorder, recurrent, moderate: Secondary | ICD-10-CM | POA: Diagnosis not present

## 2024-02-20 DIAGNOSIS — F331 Major depressive disorder, recurrent, moderate: Secondary | ICD-10-CM | POA: Diagnosis not present

## 2024-02-27 DIAGNOSIS — L710 Perioral dermatitis: Secondary | ICD-10-CM | POA: Diagnosis not present

## 2024-03-06 DIAGNOSIS — F331 Major depressive disorder, recurrent, moderate: Secondary | ICD-10-CM | POA: Diagnosis not present

## 2024-04-21 DIAGNOSIS — F331 Major depressive disorder, recurrent, moderate: Secondary | ICD-10-CM | POA: Diagnosis not present

## 2024-05-02 DIAGNOSIS — F331 Major depressive disorder, recurrent, moderate: Secondary | ICD-10-CM | POA: Diagnosis not present

## 2024-05-12 DIAGNOSIS — F331 Major depressive disorder, recurrent, moderate: Secondary | ICD-10-CM | POA: Diagnosis not present

## 2024-07-07 DIAGNOSIS — M25562 Pain in left knee: Secondary | ICD-10-CM | POA: Diagnosis not present

## 2024-07-09 DIAGNOSIS — F331 Major depressive disorder, recurrent, moderate: Secondary | ICD-10-CM | POA: Diagnosis not present

## 2024-07-15 DIAGNOSIS — M25562 Pain in left knee: Secondary | ICD-10-CM | POA: Diagnosis not present

## 2024-07-23 DIAGNOSIS — F331 Major depressive disorder, recurrent, moderate: Secondary | ICD-10-CM | POA: Diagnosis not present

## 2024-07-30 DIAGNOSIS — S83282A Other tear of lateral meniscus, current injury, left knee, initial encounter: Secondary | ICD-10-CM | POA: Diagnosis not present

## 2024-08-05 ENCOUNTER — Other Ambulatory Visit: Payer: Self-pay | Admitting: Family Medicine

## 2024-08-05 DIAGNOSIS — Z1231 Encounter for screening mammogram for malignant neoplasm of breast: Secondary | ICD-10-CM

## 2024-08-06 DIAGNOSIS — F331 Major depressive disorder, recurrent, moderate: Secondary | ICD-10-CM | POA: Diagnosis not present

## 2024-08-27 ENCOUNTER — Other Ambulatory Visit: Payer: Self-pay | Admitting: Medical Genetics

## 2024-09-02 ENCOUNTER — Ambulatory Visit
Admission: RE | Admit: 2024-09-02 | Discharge: 2024-09-02 | Disposition: A | Source: Ambulatory Visit | Attending: Family Medicine | Admitting: Family Medicine

## 2024-09-02 DIAGNOSIS — Z1231 Encounter for screening mammogram for malignant neoplasm of breast: Secondary | ICD-10-CM

## 2024-10-30 ENCOUNTER — Other Ambulatory Visit: Payer: Self-pay
